# Patient Record
Sex: Female | Born: 1940 | Race: Black or African American | Hispanic: No | Marital: Married | Smoking: Former smoker
Health system: Southern US, Community
[De-identification: ages and names within clinical notes are randomized; demographics above are authoritative.]

## PROBLEM LIST (undated history)

## (undated) DIAGNOSIS — I639 Cerebral infarction, unspecified: Secondary | ICD-10-CM

## (undated) DIAGNOSIS — E119 Type 2 diabetes mellitus without complications: Secondary | ICD-10-CM

## (undated) HISTORY — PX: ABDOMINAL HYSTERECTOMY: SHX81

---

## 1999-11-04 ENCOUNTER — Encounter: Admission: RE | Admit: 1999-11-04 | Discharge: 2000-02-02 | Payer: Self-pay | Admitting: Internal Medicine

## 2003-12-04 ENCOUNTER — Emergency Department (HOSPITAL_COMMUNITY): Admission: EM | Admit: 2003-12-04 | Discharge: 2003-12-04 | Payer: Self-pay | Admitting: Family Medicine

## 2003-12-07 ENCOUNTER — Emergency Department (HOSPITAL_COMMUNITY): Admission: EM | Admit: 2003-12-07 | Discharge: 2003-12-07 | Payer: Self-pay | Admitting: Family Medicine

## 2007-07-05 ENCOUNTER — Ambulatory Visit: Payer: Self-pay | Admitting: Vascular Surgery

## 2007-12-20 ENCOUNTER — Ambulatory Visit: Payer: Self-pay | Admitting: Vascular Surgery

## 2009-05-07 ENCOUNTER — Ambulatory Visit: Payer: Self-pay | Admitting: Vascular Surgery

## 2010-05-27 NOTE — Assessment & Plan Note (Signed)
OFFICE VISIT   Audrey Carr, Audrey Carr  DOB:  12-03-1940                                       12/20/2007  [id number]#:[id number]   The patient returns today for followup regarding her lower extremity  occlusive disease.  She was previously evaluated in June of this year  with claudication symptoms after walking about one half block.  The  right leg was worse than the left.  We discussed an angiogram because of  possible stenosis in the right mid superficial femoral artery and  actually had this scheduled but it was cancelled by her and no angiogram  was performed.  She states that since that time her symptoms have  improved and she does not feel that they are limiting her significantly.  Lower extremity Dopplers have been performed at [facility] in June  of 2009 which revealed ABIs of 0.3 bilaterally.  ABIs were not done in  our office therefore were repeated today and our results show 0.55 on  the right, 0.46 on the left due to femoral, popliteal and tibial  occlusive disease.  She denies any rest pain or history of nonhealing  ulcers and is able to ambulate fairly good distances she states.   PHYSICAL EXAMINATION:  Vital signs:  Blood pressure 155/86, heart rate  96, respirations are 18.  Neck:  Her carotid pulse is 3+, no audible  bruits.  Neurological:  Normal.  Chest:  Clear to auscultation.  Abdomen:  Soft, nontender with no masses.  She has 3+ femoral pulses  bilaterally with no popliteal or distal pulses palpable.  Both feet are  well-perfused.   I do not think any treatment is necessary for this nice lady since her  symptoms are not severe.  She does have femoral, popliteal and tibial  disease and if her symptoms worsen please let us know and we will be  happy to reevaluate her at that time but I have reassured her regarding  the above findings.   Quita Skye Hart Rochester, M.D.  Electronically Signed   JDL/MEDQ  D:  12/20/2007  T:  12/21/2007  Job:  [id number]   cc:   Loraine Leriche A. Perini, M.D.

## 2010-05-27 NOTE — Consult Note (Signed)
VASCULAR SURGERY CONSULTATION   Audrey Carr, Audrey Carr  DOB:  [birth year]                                       07/05/2007  [id number]#:[id number]   This is a vascular surgery consultation.  The patient was referred by  Dr. Waynard Edwards for a vascular surgery consultation regarding her lower  extremity circulation.  This 70 year old female states that 2-1/2 months  ago she began experiencing severe right calf discomfort after walking  about 1/2 block.  This would cause her to have to stop and rest in order  to get relief.  She has also noted some burning discomfort and  occasional numbness in both feet even at rest but has had no history of  nonhealing ulcers, infections ulceration or other problems.  This calf  discomfort is limiting her fairly significantly at the present time.  She has very minimal symptoms in contralateral left leg.  Lower  extremity arterial Dopplers were performed by [facility] on  06/23/2007 which revealed ABIs of 0.3 bilaterally with an area of high  velocity in the right mid superficial femoral artery measuring 296  cm/sec, suggesting a tight stenosis in this area.   PAST MEDICAL HISTORY:  1. Non-insulin-dependent diabetes mellitus.  2. Hypertension.  3. Negative for coronary artery disease, hyperlipidemia, CVA and COPD.   PAST SURGICAL HISTORY:  Hysterectomy.   FAMILY HISTORY:  Positive for diabetes in her father and possibly her  mother and positive for stroke in her father.  Negative for coronary  artery disease.   SOCIAL HISTORY:  She is married but is retired, her husband having  suffered a stroke and she is his caregiver.  She smokes 3/4 of a pack of  cigarettes per day for 40+ years, does not use alcohol.   REVIEW OF SYSTEMS:  Denies any chest pain, dyspnea on exertion, PND,  orthopnea, anorexia, weight loss, hemoptysis, bronchitis, wheezing, no  GI or GU symptoms.  Has arthritis and joint pain and muscle pain.   ALLERGIES:  None  known.   MEDICATIONS:  Please see health history form.  She does take aspirin and  Plavix which was just started a few days ago.   PHYSICAL EXAM:  Vital signs:  Blood pressure is 125/70, heart rate is  82, respirations 14.  General:  She is a healthy-appearing female in no  apparent distress, alert and oriented x3.  Neck:  Supple, 3+ carotid  pulses palpable.  No bruits are audible.  Neurological:  Normal.  No  palpable adenopathy in the neck.  Upper extremity pulses are 3+  bilaterally.  No skin rashes noted.  Chest:  Clear to auscultation.  Cardiovascular:  Reveals regular rhythm with no murmurs.  Abdomen:  Soft, nontender with no palpable masses.  Extremities:  Reveals 3+  femoral pulses bilaterally with absent popliteal or distal pulses.  Both  feet are slightly cool but no evidence of infection or ulceration is  noted.  No edema is noted.   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  She would like to further evaluate this so we  have scheduled her for an angiogram to be done by Dr. Myra Gianotti on 07/07  with possible PTA and stenting of her right superficial femoral artery  if indicated.  Quita Skye Hart Rochester, M.D.  Electronically Signed  JDL/MEDQ  D:  07/05/2007  T:  07/06/2007  Job:  [id number]   cc:   Loraine Leriche A. Perini, M.D.

## 2010-05-27 NOTE — Assessment & Plan Note (Signed)
OFFICE VISIT   KIAYA, HALIBURTON  DOB:  1940-01-15                                       05/07/2009  [id number]#:[id number]   The patient is a 70 year old female patient referred back by Dr. Waynard Edwards  for further vascular evaluation.  She has claudication symptoms in both  legs, right worse than left.  She is able to ambulate about 1 block  before her symptoms begin.  She has no history of rest pain, nonhealing  ulcers, infection or gangrene.  I evaluated her in June of 2009 and  again in December 2009 and her ABIs have been very stable since that  time.  She states she is able to do her daily activities as well as mow  the yard without problems.   CHRONIC MEDICAL PROBLEMS:  1. Hypertension.  2. Hyperlipidemia.  3. Diabetes mellitus type 1.  4. GERD.  5. Tobacco abuse.   FAMILY HISTORY:  She is married and retired.  Smoked three-quarters pack  cigarettes per day, has done so for 50 years.  Does not use alcohol.   REVIEW OF SYSTEMS:  Negative chest pain, dyspnea on exertion.  No  asthma, wheezing.  Has occasional arthritis and muscle pain and urinary  frequency.  All other systems on review of systems are negative.   PHYSICAL EXAMINATION:  Vital signs:  Blood pressure 161/79, heart rate  76, temperature 98.  General:  Well-developed, well-nourished female in  no apparent distress, alert and oriented x3.  HEENT:  Exam normal.  EOMs  intact.  Neck:  Supple, 3+ carotid pulses are palpable.  No bruits  audible.  Chest:  Clear to auscultation.  No wheezing.  Cardiovascular:  Regular rhythm, no murmurs.  Abdomen:  Soft, nontender with no masses.  Lower extremity exam:  Reveals 3+ femoral pulses bilaterally.  No  popliteal or distal pulses palpable.  Both feet are well-perfused.   Today I ordered lower extremity arterial Dopplers which I have reviewed  and interpreted.  Her ABIs are 0.50 on the right and 0.52 on the left,  unchanged from 18 months ago.   She does  have stable claudication which is not in need of any treatment  for limb salvage.  There is no indication for quality of life at this  point because she is content with her symptoms.  If her symptoms worsen  and she desires treatment, we will be happy to see her again in the  future for further evaluation.     Quita Skye Hart Rochester, M.D.  Electronically Signed   JDL/MEDQ  D:  05/07/2009  T:  05/08/2009  Job:  [id number]

## 2011-04-21 DIAGNOSIS — E1159 Type 2 diabetes mellitus with other circulatory complications: Secondary | ICD-10-CM | POA: Diagnosis not present

## 2011-04-21 DIAGNOSIS — I739 Peripheral vascular disease, unspecified: Secondary | ICD-10-CM | POA: Diagnosis not present

## 2011-04-21 DIAGNOSIS — E785 Hyperlipidemia, unspecified: Secondary | ICD-10-CM | POA: Diagnosis not present

## 2011-04-21 DIAGNOSIS — I10 Essential (primary) hypertension: Secondary | ICD-10-CM | POA: Diagnosis not present

## 2011-08-20 DIAGNOSIS — H2510 Age-related nuclear cataract, unspecified eye: Secondary | ICD-10-CM | POA: Diagnosis not present

## 2011-08-20 DIAGNOSIS — H04129 Dry eye syndrome of unspecified lacrimal gland: Secondary | ICD-10-CM | POA: Diagnosis not present

## 2011-08-21 DIAGNOSIS — E1159 Type 2 diabetes mellitus with other circulatory complications: Secondary | ICD-10-CM | POA: Diagnosis not present

## 2011-08-21 DIAGNOSIS — I739 Peripheral vascular disease, unspecified: Secondary | ICD-10-CM | POA: Diagnosis not present

## 2011-08-21 DIAGNOSIS — E1149 Type 2 diabetes mellitus with other diabetic neurological complication: Secondary | ICD-10-CM | POA: Diagnosis not present

## 2011-08-21 DIAGNOSIS — I10 Essential (primary) hypertension: Secondary | ICD-10-CM | POA: Diagnosis not present

## 2011-10-01 DIAGNOSIS — Z23 Encounter for immunization: Secondary | ICD-10-CM | POA: Diagnosis not present

## 2011-11-24 DIAGNOSIS — I10 Essential (primary) hypertension: Secondary | ICD-10-CM | POA: Diagnosis not present

## 2011-11-24 DIAGNOSIS — I739 Peripheral vascular disease, unspecified: Secondary | ICD-10-CM | POA: Diagnosis not present

## 2011-11-24 DIAGNOSIS — F329 Major depressive disorder, single episode, unspecified: Secondary | ICD-10-CM | POA: Diagnosis not present

## 2011-11-24 DIAGNOSIS — E119 Type 2 diabetes mellitus without complications: Secondary | ICD-10-CM | POA: Diagnosis not present

## 2012-08-10 DIAGNOSIS — I739 Peripheral vascular disease, unspecified: Secondary | ICD-10-CM | POA: Diagnosis not present

## 2012-08-10 DIAGNOSIS — IMO0002 Reserved for concepts with insufficient information to code with codable children: Secondary | ICD-10-CM | POA: Diagnosis not present

## 2012-08-10 DIAGNOSIS — I10 Essential (primary) hypertension: Secondary | ICD-10-CM | POA: Diagnosis not present

## 2012-08-10 DIAGNOSIS — E1159 Type 2 diabetes mellitus with other circulatory complications: Secondary | ICD-10-CM | POA: Diagnosis not present

## 2012-08-10 DIAGNOSIS — F329 Major depressive disorder, single episode, unspecified: Secondary | ICD-10-CM | POA: Diagnosis not present

## 2012-08-10 DIAGNOSIS — Z79899 Other long term (current) drug therapy: Secondary | ICD-10-CM | POA: Diagnosis not present

## 2012-08-10 DIAGNOSIS — Z1331 Encounter for screening for depression: Secondary | ICD-10-CM | POA: Diagnosis not present

## 2012-08-10 DIAGNOSIS — E559 Vitamin D deficiency, unspecified: Secondary | ICD-10-CM | POA: Diagnosis not present

## 2012-08-17 DIAGNOSIS — E119 Type 2 diabetes mellitus without complications: Secondary | ICD-10-CM | POA: Diagnosis not present

## 2012-08-17 DIAGNOSIS — H35039 Hypertensive retinopathy, unspecified eye: Secondary | ICD-10-CM | POA: Diagnosis not present

## 2012-08-17 DIAGNOSIS — H40019 Open angle with borderline findings, low risk, unspecified eye: Secondary | ICD-10-CM | POA: Diagnosis not present

## 2012-09-14 DIAGNOSIS — IMO0002 Reserved for concepts with insufficient information to code with codable children: Secondary | ICD-10-CM | POA: Diagnosis not present

## 2012-09-14 DIAGNOSIS — F329 Major depressive disorder, single episode, unspecified: Secondary | ICD-10-CM | POA: Diagnosis not present

## 2012-09-14 DIAGNOSIS — Z23 Encounter for immunization: Secondary | ICD-10-CM | POA: Diagnosis not present

## 2012-09-14 DIAGNOSIS — F17200 Nicotine dependence, unspecified, uncomplicated: Secondary | ICD-10-CM | POA: Diagnosis not present

## 2012-09-14 DIAGNOSIS — E1159 Type 2 diabetes mellitus with other circulatory complications: Secondary | ICD-10-CM | POA: Diagnosis not present

## 2012-09-14 DIAGNOSIS — I739 Peripheral vascular disease, unspecified: Secondary | ICD-10-CM | POA: Diagnosis not present

## 2012-09-14 DIAGNOSIS — I10 Essential (primary) hypertension: Secondary | ICD-10-CM | POA: Diagnosis not present

## 2012-09-14 DIAGNOSIS — R269 Unspecified abnormalities of gait and mobility: Secondary | ICD-10-CM | POA: Diagnosis not present

## 2012-10-27 DIAGNOSIS — E785 Hyperlipidemia, unspecified: Secondary | ICD-10-CM | POA: Diagnosis not present

## 2012-10-27 DIAGNOSIS — E119 Type 2 diabetes mellitus without complications: Secondary | ICD-10-CM | POA: Diagnosis not present

## 2012-10-27 DIAGNOSIS — IMO0002 Reserved for concepts with insufficient information to code with codable children: Secondary | ICD-10-CM | POA: Diagnosis not present

## 2012-10-27 DIAGNOSIS — R269 Unspecified abnormalities of gait and mobility: Secondary | ICD-10-CM | POA: Diagnosis not present

## 2012-10-27 DIAGNOSIS — I10 Essential (primary) hypertension: Secondary | ICD-10-CM | POA: Diagnosis not present

## 2013-01-27 DIAGNOSIS — E785 Hyperlipidemia, unspecified: Secondary | ICD-10-CM | POA: Diagnosis not present

## 2013-01-27 DIAGNOSIS — F3289 Other specified depressive episodes: Secondary | ICD-10-CM | POA: Diagnosis not present

## 2013-01-27 DIAGNOSIS — E1159 Type 2 diabetes mellitus with other circulatory complications: Secondary | ICD-10-CM | POA: Diagnosis not present

## 2013-01-27 DIAGNOSIS — F329 Major depressive disorder, single episode, unspecified: Secondary | ICD-10-CM | POA: Diagnosis not present

## 2013-01-27 DIAGNOSIS — Z23 Encounter for immunization: Secondary | ICD-10-CM | POA: Diagnosis not present

## 2013-01-27 DIAGNOSIS — I739 Peripheral vascular disease, unspecified: Secondary | ICD-10-CM | POA: Diagnosis not present

## 2013-01-27 DIAGNOSIS — I10 Essential (primary) hypertension: Secondary | ICD-10-CM | POA: Diagnosis not present

## 2013-01-27 DIAGNOSIS — IMO0002 Reserved for concepts with insufficient information to code with codable children: Secondary | ICD-10-CM | POA: Diagnosis not present

## 2013-09-12 DIAGNOSIS — IMO0002 Reserved for concepts with insufficient information to code with codable children: Secondary | ICD-10-CM | POA: Diagnosis not present

## 2013-09-12 DIAGNOSIS — Z1331 Encounter for screening for depression: Secondary | ICD-10-CM | POA: Diagnosis not present

## 2013-09-12 DIAGNOSIS — F329 Major depressive disorder, single episode, unspecified: Secondary | ICD-10-CM | POA: Diagnosis not present

## 2013-09-12 DIAGNOSIS — I739 Peripheral vascular disease, unspecified: Secondary | ICD-10-CM | POA: Diagnosis not present

## 2013-09-12 DIAGNOSIS — F17200 Nicotine dependence, unspecified, uncomplicated: Secondary | ICD-10-CM | POA: Diagnosis not present

## 2013-09-12 DIAGNOSIS — R5381 Other malaise: Secondary | ICD-10-CM | POA: Diagnosis not present

## 2013-09-12 DIAGNOSIS — R5383 Other fatigue: Secondary | ICD-10-CM | POA: Diagnosis not present

## 2013-09-12 DIAGNOSIS — E1159 Type 2 diabetes mellitus with other circulatory complications: Secondary | ICD-10-CM | POA: Diagnosis not present

## 2013-09-12 DIAGNOSIS — F3289 Other specified depressive episodes: Secondary | ICD-10-CM | POA: Diagnosis not present

## 2013-10-12 ENCOUNTER — Inpatient Hospital Stay (HOSPITAL_COMMUNITY)
Admission: EM | Admit: 2013-10-12 | Discharge: 2013-10-16 | DRG: 065 | Disposition: A | Discharge status: Skilled Nursing Facility | Payer: Medicare Other | Attending: Internal Medicine | Admitting: Internal Medicine

## 2013-10-12 ENCOUNTER — Emergency Department (HOSPITAL_COMMUNITY): Payer: Medicare Other

## 2013-10-12 ENCOUNTER — Encounter (HOSPITAL_COMMUNITY): Payer: Self-pay | Admitting: Emergency Medicine

## 2013-10-12 ENCOUNTER — Inpatient Hospital Stay (HOSPITAL_COMMUNITY): Payer: Medicare Other

## 2013-10-12 DIAGNOSIS — I5032 Chronic diastolic (congestive) heart failure: Secondary | ICD-10-CM | POA: Diagnosis present

## 2013-10-12 DIAGNOSIS — R29810 Facial weakness: Secondary | ICD-10-CM | POA: Diagnosis present

## 2013-10-12 DIAGNOSIS — I519 Heart disease, unspecified: Secondary | ICD-10-CM | POA: Diagnosis not present

## 2013-10-12 DIAGNOSIS — M6281 Muscle weakness (generalized): Secondary | ICD-10-CM | POA: Diagnosis present

## 2013-10-12 DIAGNOSIS — E785 Hyperlipidemia, unspecified: Secondary | ICD-10-CM | POA: Diagnosis present

## 2013-10-12 DIAGNOSIS — I639 Cerebral infarction, unspecified: Secondary | ICD-10-CM

## 2013-10-12 DIAGNOSIS — I6930 Unspecified sequelae of cerebral infarction: Secondary | ICD-10-CM | POA: Diagnosis not present

## 2013-10-12 DIAGNOSIS — Z23 Encounter for immunization: Secondary | ICD-10-CM | POA: Diagnosis not present

## 2013-10-12 DIAGNOSIS — R2689 Other abnormalities of gait and mobility: Secondary | ICD-10-CM | POA: Diagnosis not present

## 2013-10-12 DIAGNOSIS — R4781 Slurred speech: Secondary | ICD-10-CM | POA: Diagnosis present

## 2013-10-12 DIAGNOSIS — I63511 Cerebral infarction due to unspecified occlusion or stenosis of right middle cerebral artery: Principal | ICD-10-CM | POA: Diagnosis present

## 2013-10-12 DIAGNOSIS — I10 Essential (primary) hypertension: Secondary | ICD-10-CM | POA: Diagnosis present

## 2013-10-12 DIAGNOSIS — E119 Type 2 diabetes mellitus without complications: Secondary | ICD-10-CM

## 2013-10-12 DIAGNOSIS — Z823 Family history of stroke: Secondary | ICD-10-CM

## 2013-10-12 DIAGNOSIS — I6789 Other cerebrovascular disease: Secondary | ICD-10-CM | POA: Diagnosis not present

## 2013-10-12 DIAGNOSIS — F17210 Nicotine dependence, cigarettes, uncomplicated: Secondary | ICD-10-CM | POA: Diagnosis present

## 2013-10-12 DIAGNOSIS — Z833 Family history of diabetes mellitus: Secondary | ICD-10-CM | POA: Diagnosis not present

## 2013-10-12 DIAGNOSIS — R4181 Age-related cognitive decline: Secondary | ICD-10-CM | POA: Diagnosis not present

## 2013-10-12 DIAGNOSIS — I700 Atherosclerosis of aorta: Secondary | ICD-10-CM | POA: Diagnosis not present

## 2013-10-12 DIAGNOSIS — R41841 Cognitive communication deficit: Secondary | ICD-10-CM | POA: Diagnosis not present

## 2013-10-12 DIAGNOSIS — R278 Other lack of coordination: Secondary | ICD-10-CM | POA: Diagnosis not present

## 2013-10-12 DIAGNOSIS — E1149 Type 2 diabetes mellitus with other diabetic neurological complication: Secondary | ICD-10-CM | POA: Diagnosis not present

## 2013-10-12 DIAGNOSIS — R531 Weakness: Secondary | ICD-10-CM | POA: Diagnosis not present

## 2013-10-12 HISTORY — DX: Cerebral infarction, unspecified: I63.9

## 2013-10-12 HISTORY — DX: Type 2 diabetes mellitus without complications: E11.9

## 2013-10-12 LAB — DIFFERENTIAL
BASOS PCT: 1 % (ref 0–1)
Basophils Absolute: 0.1 10*3/uL (ref 0.0–0.1)
EOS PCT: 1 % (ref 0–5)
Eosinophils Absolute: 0.1 10*3/uL (ref 0.0–0.7)
LYMPHS PCT: 38 % (ref 12–46)
Lymphs Abs: 3 10*3/uL (ref 0.7–4.0)
MONO ABS: 0.3 10*3/uL (ref 0.1–1.0)
Monocytes Relative: 4 % (ref 3–12)
Neutro Abs: 4.4 10*3/uL (ref 1.7–7.7)
Neutrophils Relative %: 56 % (ref 43–77)

## 2013-10-12 LAB — URINALYSIS, ROUTINE W REFLEX MICROSCOPIC
BILIRUBIN URINE: NEGATIVE
HGB URINE DIPSTICK: NEGATIVE
Ketones, ur: NEGATIVE mg/dL
Leukocytes, UA: NEGATIVE
Nitrite: NEGATIVE
Protein, ur: NEGATIVE mg/dL
SPECIFIC GRAVITY, URINE: 1.03 (ref 1.005–1.030)
UROBILINOGEN UA: 0.2 mg/dL (ref 0.0–1.0)
pH: 6.5 (ref 5.0–8.0)

## 2013-10-12 LAB — I-STAT CHEM 8, ED
BUN: 14 mg/dL (ref 6–23)
Calcium, Ion: 1.18 mmol/L (ref 1.13–1.30)
Chloride: 105 mEq/L (ref 96–112)
Creatinine, Ser: 0.7 mg/dL (ref 0.50–1.10)
Glucose, Bld: 175 mg/dL — ABNORMAL HIGH (ref 70–99)
HCT: 48 % — ABNORMAL HIGH (ref 36.0–46.0)
Hemoglobin: 16.3 g/dL — ABNORMAL HIGH (ref 12.0–15.0)
Potassium: 4.1 mEq/L (ref 3.7–5.3)
SODIUM: 141 meq/L (ref 137–147)
TCO2: 25 mmol/L (ref 0–100)

## 2013-10-12 LAB — APTT: aPTT: 25 seconds (ref 24–37)

## 2013-10-12 LAB — RAPID URINE DRUG SCREEN, HOSP PERFORMED
Amphetamines: NOT DETECTED
Barbiturates: NOT DETECTED
Benzodiazepines: NOT DETECTED
COCAINE: NOT DETECTED
OPIATES: NOT DETECTED
TETRAHYDROCANNABINOL: NOT DETECTED

## 2013-10-12 LAB — COMPREHENSIVE METABOLIC PANEL
ALT: 9 U/L (ref 0–35)
ANION GAP: 16 — AB (ref 5–15)
AST: 15 U/L (ref 0–37)
Albumin: 4.1 g/dL (ref 3.5–5.2)
Alkaline Phosphatase: 131 U/L — ABNORMAL HIGH (ref 39–117)
BUN: 13 mg/dL (ref 6–23)
CALCIUM: 10 mg/dL (ref 8.4–10.5)
CO2: 25 meq/L (ref 19–32)
CREATININE: 0.72 mg/dL (ref 0.50–1.10)
Chloride: 103 mEq/L (ref 96–112)
GFR calc Af Amer: >90 mL/min (ref >90)
GFR, EST NON AFRICAN AMERICAN: 83 mL/min — AB (ref >90)
Glucose, Bld: 174 mg/dL — ABNORMAL HIGH (ref 70–99)
Potassium: 4.3 mEq/L (ref 3.7–5.3)
Sodium: 144 mEq/L (ref 137–147)
Total Bilirubin: 0.3 mg/dL (ref 0.3–1.2)
Total Protein: 8.1 g/dL (ref 6.0–8.3)

## 2013-10-12 LAB — CBC
HEMATOCRIT: 44.8 % (ref 36.0–46.0)
Hemoglobin: 15.3 g/dL — ABNORMAL HIGH (ref 12.0–15.0)
MCH: 31.5 pg (ref 26.0–34.0)
MCHC: 34.2 g/dL (ref 30.0–36.0)
MCV: 92.4 fL (ref 78.0–100.0)
Platelets: 238 10*3/uL (ref 150–400)
RBC: 4.85 MIL/uL (ref 3.87–5.11)
RDW: 14.1 % (ref 11.5–15.5)
WBC: 7.8 10*3/uL (ref 4.0–10.5)

## 2013-10-12 LAB — PROTIME-INR
INR: 0.94 (ref 0.00–1.49)
Prothrombin Time: 12.6 seconds (ref 11.6–15.2)

## 2013-10-12 LAB — ETHANOL

## 2013-10-12 LAB — I-STAT TROPONIN, ED: TROPONIN I, POC: 0 ng/mL (ref 0.00–0.08)

## 2013-10-12 LAB — URINE MICROSCOPIC-ADD ON

## 2013-10-12 LAB — GLUCOSE, CAPILLARY: GLUCOSE-CAPILLARY: 132 mg/dL — AB (ref 70–99)

## 2013-10-12 MED ORDER — ASPIRIN 325 MG PO TABS
325.0000 mg | ORAL_TABLET | Freq: Every day | ORAL | Status: DC
  Administered 2013-10-13 – 2013-10-14 (×2): 325 mg via ORAL
  Filled 2013-10-12 (×2): qty 1

## 2013-10-12 MED ORDER — ASPIRIN 300 MG RE SUPP
300.0000 mg | Freq: Every day | RECTAL | Status: DC
  Filled 2013-10-12 (×2): qty 1

## 2013-10-12 MED ORDER — GLIPIZIDE ER 10 MG PO TB24
10.0000 mg | ORAL_TABLET | Freq: Every day | ORAL | Status: DC
  Administered 2013-10-14 – 2013-10-16 (×3): 10 mg via ORAL
  Filled 2013-10-12 (×5): qty 1

## 2013-10-12 MED ORDER — ATORVASTATIN CALCIUM 40 MG PO TABS
40.0000 mg | ORAL_TABLET | Freq: Every day | ORAL | Status: DC
  Administered 2013-10-13: 40 mg via ORAL
  Filled 2013-10-12: qty 1

## 2013-10-12 MED ORDER — INSULIN ASPART 100 UNIT/ML ~~LOC~~ SOLN
0.0000 [IU] | Freq: Three times a day (TID) | SUBCUTANEOUS | Status: DC
  Administered 2013-10-13: 1 [IU] via SUBCUTANEOUS
  Administered 2013-10-13 (×2): 2 [IU] via SUBCUTANEOUS
  Administered 2013-10-14: 3 [IU] via SUBCUTANEOUS
  Administered 2013-10-14 (×2): 2 [IU] via SUBCUTANEOUS
  Administered 2013-10-15: 5 [IU] via SUBCUTANEOUS
  Administered 2013-10-15 – 2013-10-16 (×2): 2 [IU] via SUBCUTANEOUS
  Administered 2013-10-16: 1 [IU] via SUBCUTANEOUS

## 2013-10-12 MED ORDER — AMLODIPINE BESYLATE 5 MG PO TABS
5.0000 mg | ORAL_TABLET | Freq: Every day | ORAL | Status: DC
  Administered 2013-10-13: 5 mg via ORAL
  Filled 2013-10-12: qty 1

## 2013-10-12 MED ORDER — SENNOSIDES-DOCUSATE SODIUM 8.6-50 MG PO TABS
1.0000 | ORAL_TABLET | Freq: Every evening | ORAL | Status: DC | PRN
  Filled 2013-10-12: qty 1

## 2013-10-12 MED ORDER — ENOXAPARIN SODIUM 40 MG/0.4ML ~~LOC~~ SOLN
40.0000 mg | SUBCUTANEOUS | Status: DC
  Administered 2013-10-12 – 2013-10-15 (×4): 40 mg via SUBCUTANEOUS
  Filled 2013-10-12 (×5): qty 0.4

## 2013-10-12 MED ORDER — SODIUM CHLORIDE 0.9 % IV SOLN
INTRAVENOUS | Status: DC
  Administered 2013-10-12: via INTRAVENOUS

## 2013-10-12 MED ORDER — ESCITALOPRAM OXALATE 10 MG PO TABS
10.0000 mg | ORAL_TABLET | Freq: Every day | ORAL | Status: DC
Start: 2013-10-13 — End: 2013-10-16
  Administered 2013-10-13 – 2013-10-16 (×4): 10 mg via ORAL
  Filled 2013-10-12 (×4): qty 1

## 2013-10-12 MED ORDER — STROKE: EARLY STAGES OF RECOVERY BOOK
Freq: Once | Status: AC
  Administered 2013-10-12: 22:00:00
  Filled 2013-10-12: qty 1

## 2013-10-12 NOTE — Progress Notes (Signed)
AHal Hope notified about BP 183/104. No orders to treat unless BP systolic >562. Will continue to monitor pt, currently asymptomatic.   Prescilla Sours, Therapist, sports

## 2013-10-12 NOTE — ED Notes (Signed)
Pt presents from home via [facility] with c/o facial droop. Pt lives alone and saw her neighbor last night around 2100 and he noticed left sided facial droop. Pt declined medical attention at that time. Pt saw the same neighbor today and he contacted 911 for the patient to be evaluated.  Pt has no other complaints.  Per EMS grips are equal and patient denies visual changes.

## 2013-10-12 NOTE — Consult Note (Signed)
Referring Physician: Frederich Cha    Chief Complaint: New onset left facial droop.  HPI: Audrey Carr is an 73 y.o. female history diabetes mellitus, hypertension and hyperlipidemia presenting with new onset left lower facial droop which was first noticed about 8 PM last night. Patient has also developed slurring of speech. She's had no difficulty with swallowing. She said no left upper normal left lower extremity weakness no numbness. Has no previous history of stroke or TIA. She has not been on antiplatelet therapy. MRI of her brain showed moderate size acute right MCA infarction. 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 NIH stroke score was 3.  LSN: 8 PM on 10/11/2013 tPA Given: No: Beyond time under for treatment consideration mRankin:  Past Medical History  Diagnosis Date  . Diabetes mellitus without complication     No family history on file.   Medications: I have reviewed the patient's current medications.  ROS: History obtained from the patient  General ROS: negative for - chills, fatigue, fever, night sweats, weight gain or weight loss Psychological ROS: negative for - behavioral disorder, hallucinations, memory difficulties, mood swings or suicidal ideation Ophthalmic ROS: negative for - blurry vision, double vision, eye pain or loss of vision ENT ROS: negative for - epistaxis, nasal discharge, oral lesions, sore throat, tinnitus or vertigo Allergy and Immunology ROS: negative for - hives or itchy/watery eyes Hematological and Lymphatic ROS: negative for - bleeding problems, bruising or swollen lymph nodes Endocrine ROS: negative for - galactorrhea, hair pattern changes, polydipsia/polyuria or temperature intolerance Respiratory ROS: negative for - cough, hemoptysis, shortness of breath or wheezing Cardiovascular ROS: negative for - chest pain, dyspnea on exertion, edema or irregular  heartbeat Gastrointestinal ROS: negative for - abdominal pain, diarrhea, hematemesis, nausea/vomiting or stool incontinence Genito-Urinary ROS: negative for - dysuria, hematuria, incontinence or urinary frequency/urgency Musculoskeletal ROS: negative for - joint swelling or muscular weakness Neurological ROS: as noted in HPI Dermatological ROS: negative for rash and skin lesion changes  Physical Examination: Blood pressure 176/84, pulse 72, temperature 97.9 F (36.6 C), temperature source Oral, resp. rate 15, height 5\' 3"  (1.6 m), SpO2 100.00%.  Neurologic Examination: Mental Status: Alert, oriented, thought content appropriate.  Speech slightly slurred without evidence of aphasia. Able to follow commands without difficulty. Cranial Nerves: II-Visual fields were normal. III/IV/VI-Pupils were equal and reacted. Extraocular movements were full and conjugate.    V/VII-no facial numbness; moderate left lower facial weakness. VIII-normal. X-mild dysarthria; symmetrical palatal movement. XII-midline tongue extension Motor: 5/5 bilaterally with normal tone and bulk Sensory: Normal throughout. Deep Tendon Reflexes: 1+ and symmetric. Plantars: Mute bilaterally Cerebellar: Normal finger-to-nose testing.  Ct Head Wo Contrast  10/12/2013   CLINICAL DATA:  New onset of facial droop ; no history of CNS abnormality  EXAM: CT HEAD WITHOUT CONTRAST  TECHNIQUE: Contiguous axial images were obtained from the base of the skull through the vertex without intravenous contrast.  COMPARISON:  None.  FINDINGS: There is mild diffuse cerebral and cerebellar atrophy with compensatory ventriculomegaly. There is decreased density in the deep white matter of both cerebral hemispheres. This is greater on the right in the frontal region that elsewhere. There are basal ganglia calcifications bilaterally. There is no acute intracranial hemorrhage. The cerebellum and brainstem are unremarkable.  The observed paranasal  sinuses and mastoid air cells are clear. The middle ear cavities are well pneumatized. The internal auditory canals are unremarkable.  IMPRESSION: 1. There is no  acute intracranial hemorrhage nor objective evidence of acute ischemic change. 2. There is decreased density in the deep white matter of both cerebral hemispheres, greatest in the right frontal region, consistent with chronic small vessel ischemic change. If the patient's clinical findings do not reflect a typical Bell's palsy, MRI of the brain now may be useful.   Electronically Signed   By: David  Martinique   On: 10/12/2013 15:35   Mr Jodene Nam Head Wo Contrast  10/12/2013   CLINICAL DATA:  Left-sided facial droop since last night.  EXAM: MRI HEAD WITHOUT CONTRAST  MRA HEAD WITHOUT CONTRAST  TECHNIQUE: Multiplanar, multiecho pulse sequences of the brain and surrounding structures were obtained without intravenous contrast. Angiographic images of the head were obtained using MRA technique without contrast.  COMPARISON:  Head CT 10/12/2013  FINDINGS: MRI HEAD FINDINGS  There is a moderate-sized acute infarct involving the right cerebral hemisphere predominantly in the white matter of the right frontal lobe and corona radiata in the MCA territory. There is no definite evidence of acute hemorrhage associated with the area of infarction. A few scattered remote microhemorrhages are noted in the cerebral hemispheres. Foci of T2 hyperintensity in the subcortical and deep cerebral white matter and pons are nonspecific but compatible with mild chronic small vessel ischemic disease. There is mild generalized cerebral atrophy. There is no mass, midline shift, or extra-axial fluid collection.  Orbits are unremarkable. Paranasal sinuses and mastoid air cells are clear. Major intracranial vascular flow voids are preserved.  MRA HEAD FINDINGS  Images are moderately to severely degraded by motion artifact. Visualized distal vertebral arteries are patent with the right being  dominant. There is mild, diffuse irregularity of the intracranial right vertebral artery with moderate stenosis distally. PICA origins are patent. Right AICA origin appears patent. Basilar artery is patent with evaluation for stenosis limited by extensive motion artifact in its proximal and mid portions. P1 segments are patent without evidence of stenosis. Moderate bilateral PCA branch vessel irregular narrowing is present. Posterior communicating arteries are not clearly identified.  Proximal intracranial internal carotid arteries are patent without evidence of stenosis. There is severe motion artifact through the cavernous and supraclinoid carotid, limiting evaluation. At least moderate if not severe right cavernous and proximal supraclinoid ICA stenosis is suspected, however motion limits characterization. Mild cavernous carotid stenosis is also suspected on the left.  Right M1 segment is patent with at most mild narrowing in its midportion. M2 divisions are patent at their origins, however there is occlusion of the superior M2 division approximately 5 mm beyond its origin. Left M1 segment is patent without stenosis. Moderate left MCA branch vessel irregularity is present. A1 segments are patent with mild right greater than left vessel irregularity but no high-grade stenosis. Mild-to-moderate ACA branch vessel irregularity is present. No gross intracranial aneurysm is identified.  IMPRESSION: 1. Moderate-sized, acute right MCA infarct. 2. Mild chronic small vessel ischemic disease and cerebral atrophy. Scattered, VIII cerebral micro hemorrhages. 3. Moderately to severely motion degraded head MRA. Right M2 superior division occlusion just beyond its origin. 4. 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   Electronically Signed   By: Logan Bores   On: 10/12/2013 19:55   Mr Brain Wo Contrast  10/12/2013   CLINICAL DATA:  Left-sided facial droop since last night.  EXAM: MRI HEAD  WITHOUT CONTRAST  MRA HEAD WITHOUT CONTRAST  TECHNIQUE: Multiplanar, multiecho pulse sequences of the brain and surrounding structures were obtained without intravenous contrast. Angiographic images of the  head were obtained using MRA technique without contrast.  COMPARISON:  Head CT 10/12/2013  FINDINGS: MRI HEAD FINDINGS  There is a moderate-sized acute infarct involving the right cerebral hemisphere predominantly in the white matter of the right frontal lobe and corona radiata in the MCA territory. There is no definite evidence of acute hemorrhage associated with the area of infarction. A few scattered remote microhemorrhages are noted in the cerebral hemispheres. Foci of T2 hyperintensity in the subcortical and deep cerebral white matter and pons are nonspecific but compatible with mild chronic small vessel ischemic disease. There is mild generalized cerebral atrophy. There is no mass, midline shift, or extra-axial fluid collection.  Orbits are unremarkable. Paranasal sinuses and mastoid air cells are clear. Major intracranial vascular flow voids are preserved.  MRA HEAD FINDINGS  Images are moderately to severely degraded by motion artifact. Visualized distal vertebral arteries are patent with the right being dominant. There is mild, diffuse irregularity of the intracranial right vertebral artery with moderate stenosis distally. PICA origins are patent. Right AICA origin appears patent. Basilar artery is patent with evaluation for stenosis limited by extensive motion artifact in its proximal and mid portions. P1 segments are patent without evidence of stenosis. Moderate bilateral PCA branch vessel irregular narrowing is present. Posterior communicating arteries are not clearly identified.  Proximal intracranial internal carotid arteries are patent without evidence of stenosis. There is severe motion artifact through the cavernous and supraclinoid carotid, limiting evaluation. At least moderate if not severe  right cavernous and proximal supraclinoid ICA stenosis is suspected, however motion limits characterization. Mild cavernous carotid stenosis is also suspected on the left.  Right M1 segment is patent with at most mild narrowing in its midportion. M2 divisions are patent at their origins, however there is occlusion of the superior M2 division approximately 5 mm beyond its origin. Left M1 segment is patent without stenosis. Moderate left MCA branch vessel irregularity is present. A1 segments are patent with mild right greater than left vessel irregularity but no high-grade stenosis. Mild-to-moderate ACA branch vessel irregularity is present. No gross intracranial aneurysm is identified.  IMPRESSION: 1. Moderate-sized, acute right MCA infarct. 2. Mild chronic small vessel ischemic disease and cerebral atrophy. Scattered, VIII cerebral micro hemorrhages. 3. Moderately to severely motion degraded head MRA. Right M2 superior division occlusion just beyond its origin. 4. 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   Electronically Signed   By: Logan Bores   On: 10/12/2013 19:55    Assessment: 73 y.o. female with multiple risk factors for stroke presenting with acute right MCA territory ischemic infarction.  Stroke Risk Factors - diabetes mellitus, family history, hyperlipidemia and hypertension  Plan: 1. HgbA1c, fasting lipid panel and 2. PT consult, OT consult, Speech consult 3. Echocardiogram 4. Carotid dopplers 5. Prophylactic therapy-Antiplatelet med: Aspirin 325 mg per day 6. Risk factor modification 7. Telemetry monitoring   C.R. Nicole Kindred, MD [facility] [phone]  10/12/2013, 8:14 PM

## 2013-10-12 NOTE — ED Provider Notes (Addendum)
CSN: [id number]     Arrival date & time 10/12/13  1453 History   First MD Initiated Contact with Patient 10/12/13 1455     Chief Complaint  Patient presents with  . Facial Droop     (Consider location/radiation/quality/duration/timing/severity/associated sxs/prior Treatment) HPI Comments: Patient brought to the ER by ambulance for evaluation of facial droop. Patient went to her neighbor's house yesterday and the newborn noticed that the left side of her face was drooping. He tried to get her to go to the hospital and, but patient declined. When symptoms were persistent today, EMS was called. The patient has no complaints, she does not perceive any asymmetry. She has not had any headache. She denies numbness, tingling or weakness in any extremities. She has not had any speech difficulty.  PCP: Dr. Joylene Draft  Past Medical History  Diagnosis Date  . Diabetes mellitus without complication    Past Surgical History  Procedure Laterality Date  . Abdominal hysterectomy     No family history on file. History  Substance Use Topics  . Smoking status: Current Every Day Smoker -- 1.00 packs/day    Types: Cigarettes  . Smokeless tobacco: Never Used  . Alcohol Use: No   OB History   Grav Para Term Preterm Abortions TAB SAB Ect Mult Living                 Review of Systems  Respiratory: Negative for shortness of breath.   Cardiovascular: Negative for chest pain.  Neurological: Negative for dizziness, syncope and headaches.  All other systems reviewed and are negative.     Allergies  Review of patient's allergies indicates no known allergies.  Home Medications   Prior to Admission medications   Medication Sig Start Date End Date Taking? Authorizing Provider  amLODipine (NORVASC) 5 MG tablet Take 5 mg by mouth daily.   Yes Historical Provider, MD  atorvastatin (LIPITOR) 40 MG tablet Take 40 mg by mouth daily.   Yes Historical Provider, MD  escitalopram (LEXAPRO) 10 MG tablet Take 10  mg by mouth daily.   Yes Historical Provider, MD  glipiZIDE (GLUCOTROL XL) 10 MG 24 hr tablet Take 10 mg by mouth daily with breakfast.   Yes Historical Provider, MD  metFORMIN (GLUCOPHAGE) 500 MG tablet Take 500 mg by mouth 2 (two) times daily with a meal.   Yes Historical Provider, MD   BP 178/88  Pulse 77  Temp(Src) 97.9 F (36.6 C) (Oral)  Resp 16  Ht 5\' 3"  (1.6 m)  SpO2 100% Physical Exam  Constitutional: She is oriented to person, place, and time. She appears well-developed and well-nourished. No distress.  HENT:  Head: Normocephalic and atraumatic.  Right Ear: Hearing normal.  Left Ear: Hearing normal.  Nose: Nose normal.  Mouth/Throat: Oropharynx is clear and moist and mucous membranes are normal.  Eyes: Conjunctivae and EOM are normal. Pupils are equal, round, and reactive to light.  Neck: Normal range of motion. Neck supple.  Cardiovascular: Regular rhythm, S1 normal and S2 normal.  Exam reveals no gallop and no friction rub.   No murmur heard. Pulmonary/Chest: Effort normal and breath sounds normal. No respiratory distress. She exhibits no tenderness.  Abdominal: Soft. Normal appearance and bowel sounds are normal. There is no hepatosplenomegaly. There is no tenderness. There is no rebound, no guarding, no tenderness at McBurney's point and negative Murphy's sign. No hernia.  Musculoskeletal: Normal range of motion.  Neurological: She is alert and oriented to person, place, and time. She  has normal strength. A cranial nerve deficit (left facial droop, lower motor only (blunting of nasolabial fold with drooping of face)) is present. No sensory deficit. Coordination normal. GCS eye subscore is 4. GCS verbal subscore is 5. GCS motor subscore is 6.  Extraocular muscle movement: normal No visual field cut Pupils: equal and reactive both direct and consensual response is normal No nystagmus present    Sensory function is intact to light touch Proprioception intact  Grip  strength 5/5 symmetric in upper extremities  slight pronator drift on left (flexing of finger, rotation at wrist, no drop)  Lower extremity strength 4/5 against gravity, symmetric  Normal finger to nose bilaterally Normal heel to shin bilaterally   Skin: Skin is warm, dry and intact. No rash noted. No cyanosis.  Psychiatric: She has a normal mood and affect. Her speech is normal and behavior is normal. Thought content normal.    ED Course  Procedures (including critical care time) Labs Review Labs Reviewed  CBC - Abnormal; Notable for the following:    Hemoglobin 15.3 (*)    All other components within normal limits  COMPREHENSIVE METABOLIC PANEL - Abnormal; Notable for the following:    Glucose, Bld 174 (*)    Alkaline Phosphatase 131 (*)    GFR calc non Af Amer 83 (*)    Anion gap 16 (*)    All other components within normal limits  I-STAT CHEM 8, ED - Abnormal; Notable for the following:    Glucose, Bld 175 (*)    Hemoglobin 16.3 (*)    HCT 48.0 (*)    All other components within normal limits  ETHANOL  PROTIME-INR  APTT  DIFFERENTIAL  URINE RAPID DRUG SCREEN (HOSP PERFORMED)  URINALYSIS, ROUTINE W REFLEX MICROSCOPIC  I-STAT TROPOININ, ED    Imaging Review Ct Head Wo Contrast  10/12/2013   CLINICAL DATA:  New onset of facial droop ; no history of CNS abnormality  EXAM: CT HEAD WITHOUT CONTRAST  TECHNIQUE: Contiguous axial images were obtained from the base of the skull through the vertex without intravenous contrast.  COMPARISON:  None.  FINDINGS: There is mild diffuse cerebral and cerebellar atrophy with compensatory ventriculomegaly. There is decreased density in the deep white matter of both cerebral hemispheres. This is greater on the right in the frontal region that elsewhere. There are basal ganglia calcifications bilaterally. There is no acute intracranial hemorrhage. The cerebellum and brainstem are unremarkable.  The observed paranasal sinuses and mastoid air  cells are clear. The middle ear cavities are well pneumatized. The internal auditory canals are unremarkable.  IMPRESSION: 1. There is no acute intracranial hemorrhage nor objective evidence of acute ischemic change. 2. There is decreased density in the deep white matter of both cerebral hemispheres, greatest in the right frontal region, consistent with chronic small vessel ischemic change. If the patient's clinical findings do not reflect a typical Bell's palsy, MRI of the brain now may be useful.   Electronically Signed   By: David  Martinique   On: 10/12/2013 15:35     EKG Interpretation   Date/Time:  Thursday October 12 2013 15:11:29 EDT Ventricular Rate:  65 PR Interval:  177 QRS Duration: 95 QT Interval:  408 QTC Calculation: 424 R Axis:   35 Text Interpretation:  Sinus rhythm Probable anteroseptal infarct, old No  significant change since last tracing Confirmed by Kobie Whidby  MD,  [place] [id number]) on 10/12/2013 3:20:35 PM      MDM   Final diagnoses:  None  CVA   Patient presents to the ER for evaluation of left facial droop. Patient does not feel any changes in sensation on the left side of her face, however. Her change was noted by her neighbor. Examination did reveal blunting of the nasolabial fold on the left with drooping of the left side of her face. She also had a very slight pronator drift in her left upper extremity. CT scan was unremarkable.  MRI was performed to further evaluate. There is evidence of CVA seen on the MRI. Neurology consult has been obtained, Doctor Nicole Kindred has seen the patient. Patient will be admitted to the hospital for further management.    Orpah Greek, MD 10/12/13 [place], MD 10/12/13 2025

## 2013-10-12 NOTE — H&P (Signed)
[facility] History and Physical  Audrey Carr NWG:[id number] DOB: [birth year] DOA: 10/12/2013  Referring physician: ER physician. PCP: PROVIDER NOT IN SYSTEM Dr. Joylene Draft.  Chief Complaint: Left facial droop.  HPI: Audrey Carr is a 73 y.o. female with history of hypertension, diabetes mellitus and ongoing tobacco abuse was brought to the ER after patient was noticed to have persistent left facial droop. Patient was initially noted to have a facial droop last night by patient's neighbor on 8 PM. Since patient's symptoms were persistent patient was brought to the ER. In the ER patient had CT head followed by MRI brain which showed acute right MCA stroke. On-call neurologist Dr. Nicole Kindred was consulted and at this time patient has been admitted for further management of the acute stroke. In the ER on exam patient initially had left pronator drift which has improved. Patient also has slurred speech but denies any difficulty swallowing or any visual symptoms headache. Denies any chest pain shortness of breath nausea vomiting or abdominal pain.   Review of Systems: As presented in the history of presenting illness, rest negative.  Past Medical History  Diagnosis Date  . Diabetes mellitus without complication    Past Surgical History  Procedure Laterality Date  . Abdominal hysterectomy     Social History:  reports that she has been smoking Cigarettes.  She has been smoking about 1.00 pack per day. She has never used smokeless tobacco. She reports that she does not drink alcohol or use illicit drugs. Where does patient live home. Can patient participate in ADLs? Yes.  No Known Allergies  Family History:  Family History  Problem Relation Age of Onset  . Stroke Mother       Prior to Admission medications   Medication Sig Start Date End Date Taking? Authorizing Provider  amLODipine (NORVASC) 5 MG tablet Take 5 mg by mouth daily.   Yes Historical Provider, MD  atorvastatin (LIPITOR)  40 MG tablet Take 40 mg by mouth daily.   Yes Historical Provider, MD  escitalopram (LEXAPRO) 10 MG tablet Take 10 mg by mouth daily.   Yes Historical Provider, MD  glipiZIDE (GLUCOTROL XL) 10 MG 24 hr tablet Take 10 mg by mouth daily with breakfast.   Yes Historical Provider, MD  metFORMIN (GLUCOPHAGE) 500 MG tablet Take 500 mg by mouth 2 (two) times daily with a meal.   Yes Historical Provider, MD    Physical Exam: Filed Vitals:   10/12/13 1700 10/12/13 1730 10/12/13 1800 10/12/13 2031  BP: 190/79 184/95 176/84 183/68  Pulse: 65 76 72 69  Temp:    98.1 F (36.7 C)  TempSrc:    Oral  Resp: 13 21 15 16   Height:      SpO2: 100% 100% 100% 99%     General:  Moderately built and nourished.  Eyes: Anicteric no pallor.  ENT: Left facial droop.  Neck: No mass felt.  Cardiovascular: S1-S2 heard.  Respiratory: No rhonchi or crepitations.  Abdomen: Soft nontender bowel sounds present.  Skin: No rash.  Musculoskeletal: No edema.  Psychiatric: Appears normal.  Neurologic: Alert awake oriented to time place and person. Moves all extremities 5 x 5. Left facial droop. No tongue deviation. PERRLA positive.  Labs on Admission:  Basic Metabolic Panel:  Recent Labs Lab 10/12/13 1615 10/12/13 1625  NA 144 141  K 4.3 4.1  CL 103 105  CO2 25  --   GLUCOSE 174* 175*  BUN 13 14  CREATININE 0.72 0.70  CALCIUM  10.0  --    Liver Function Tests:  Recent Labs Lab 10/12/13 1615  AST 15  ALT 9  ALKPHOS 131*  BILITOT 0.3  PROT 8.1  ALBUMIN 4.1   No results found for this basename: LIPASE, AMYLASE,  in the last 168 hours No results found for this basename: AMMONIA,  in the last 168 hours CBC:  Recent Labs Lab 10/12/13 1615 10/12/13 1625  WBC 7.8  --   NEUTROABS 4.4  --   HGB 15.3* 16.3*  HCT 44.8 48.0*  MCV 92.4  --   PLT 238  --    Cardiac Enzymes: No results found for this basename: CKTOTAL, CKMB, CKMBINDEX, TROPONINI,  in the last 168 hours  BNP (last 3  results) No results found for this basename: PROBNP,  in the last 8760 hours CBG: No results found for this basename: GLUCAP,  in the last 168 hours  Radiological Exams on Admission: Ct Head Wo Contrast  10/12/2013   CLINICAL DATA:  New onset of facial droop ; no history of CNS abnormality  EXAM: CT HEAD WITHOUT CONTRAST  TECHNIQUE: Contiguous axial images were obtained from the base of the skull through the vertex without intravenous contrast.  COMPARISON:  None.  FINDINGS: There is mild diffuse cerebral and cerebellar atrophy with compensatory ventriculomegaly. There is decreased density in the deep white matter of both cerebral hemispheres. This is greater on the right in the frontal region that elsewhere. There are basal ganglia calcifications bilaterally. There is no acute intracranial hemorrhage. The cerebellum and brainstem are unremarkable.  The observed paranasal sinuses and mastoid air cells are clear. The middle ear cavities are well pneumatized. The internal auditory canals are unremarkable.  IMPRESSION: 1. There is no acute intracranial hemorrhage nor objective evidence of acute ischemic change. 2. There is decreased density in the deep white matter of both cerebral hemispheres, greatest in the right frontal region, consistent with chronic small vessel ischemic change. If the patient's clinical findings do not reflect a typical Bell's palsy, MRI of the brain now may be useful.   Electronically Signed   By: David  Martinique   On: 10/12/2013 15:35   Mr Jodene Nam Head Wo Contrast  10/12/2013   CLINICAL DATA:  Left-sided facial droop since last night.  EXAM: MRI HEAD WITHOUT CONTRAST  MRA HEAD WITHOUT CONTRAST  TECHNIQUE: Multiplanar, multiecho pulse sequences of the brain and surrounding structures were obtained without intravenous contrast. Angiographic images of the head were obtained using MRA technique without contrast.  COMPARISON:  Head CT 10/12/2013  FINDINGS: MRI HEAD FINDINGS  There is a  moderate-sized acute infarct involving the right cerebral hemisphere predominantly in the white matter of the right frontal lobe and corona radiata in the MCA territory. There is no definite evidence of acute hemorrhage associated with the area of infarction. A few scattered remote microhemorrhages are noted in the cerebral hemispheres. Foci of T2 hyperintensity in the subcortical and deep cerebral white matter and pons are nonspecific but compatible with mild chronic small vessel ischemic disease. There is mild generalized cerebral atrophy. There is no mass, midline shift, or extra-axial fluid collection.  Orbits are unremarkable. Paranasal sinuses and mastoid air cells are clear. Major intracranial vascular flow voids are preserved.  MRA HEAD FINDINGS  Images are moderately to severely degraded by motion artifact. Visualized distal vertebral arteries are patent with the right being dominant. There is mild, diffuse irregularity of the intracranial right vertebral artery with moderate stenosis distally. PICA origins are patent.  Right AICA origin appears patent. Basilar artery is patent with evaluation for stenosis limited by extensive motion artifact in its proximal and mid portions. P1 segments are patent without evidence of stenosis. Moderate bilateral PCA branch vessel irregular narrowing is present. Posterior communicating arteries are not clearly identified.  Proximal intracranial internal carotid arteries are patent without evidence of stenosis. There is severe motion artifact through the cavernous and supraclinoid carotid, limiting evaluation. At least moderate if not severe right cavernous and proximal supraclinoid ICA stenosis is suspected, however motion limits characterization. Mild cavernous carotid stenosis is also suspected on the left.  Right M1 segment is patent with at most mild narrowing in its midportion. M2 divisions are patent at their origins, however there is occlusion of the superior M2  division approximately 5 mm beyond its origin. Left M1 segment is patent without stenosis. Moderate left MCA branch vessel irregularity is present. A1 segments are patent with mild right greater than left vessel irregularity but no high-grade stenosis. Mild-to-moderate ACA branch vessel irregularity is present. No gross intracranial aneurysm is identified.  IMPRESSION: 1. Moderate-sized, acute right MCA infarct. 2. Mild chronic small vessel ischemic disease and cerebral atrophy. Scattered, VIII cerebral micro hemorrhages. 3. Moderately to severely motion degraded head MRA. Right M2 superior division occlusion just beyond its origin. 4. 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   Electronically Signed   By: Logan Bores   On: 10/12/2013 19:55   Mr Brain Wo Contrast  10/12/2013   CLINICAL DATA:  Left-sided facial droop since last night.  EXAM: MRI HEAD WITHOUT CONTRAST  MRA HEAD WITHOUT CONTRAST  TECHNIQUE: Multiplanar, multiecho pulse sequences of the brain and surrounding structures were obtained without intravenous contrast. Angiographic images of the head were obtained using MRA technique without contrast.  COMPARISON:  Head CT 10/12/2013  FINDINGS: MRI HEAD FINDINGS  There is a moderate-sized acute infarct involving the right cerebral hemisphere predominantly in the white matter of the right frontal lobe and corona radiata in the MCA territory. There is no definite evidence of acute hemorrhage associated with the area of infarction. A few scattered remote microhemorrhages are noted in the cerebral hemispheres. Foci of T2 hyperintensity in the subcortical and deep cerebral white matter and pons are nonspecific but compatible with mild chronic small vessel ischemic disease. There is mild generalized cerebral atrophy. There is no mass, midline shift, or extra-axial fluid collection.  Orbits are unremarkable. Paranasal sinuses and mastoid air cells are clear. Major intracranial  vascular flow voids are preserved.  MRA HEAD FINDINGS  Images are moderately to severely degraded by motion artifact. Visualized distal vertebral arteries are patent with the right being dominant. There is mild, diffuse irregularity of the intracranial right vertebral artery with moderate stenosis distally. PICA origins are patent. Right AICA origin appears patent. Basilar artery is patent with evaluation for stenosis limited by extensive motion artifact in its proximal and mid portions. P1 segments are patent without evidence of stenosis. Moderate bilateral PCA branch vessel irregular narrowing is present. Posterior communicating arteries are not clearly identified.  Proximal intracranial internal carotid arteries are patent without evidence of stenosis. There is severe motion artifact through the cavernous and supraclinoid carotid, limiting evaluation. At least moderate if not severe right cavernous and proximal supraclinoid ICA stenosis is suspected, however motion limits characterization. Mild cavernous carotid stenosis is also suspected on the left.  Right M1 segment is patent with at most mild narrowing in its midportion. M2 divisions are patent at their origins, however  there is occlusion of the superior M2 division approximately 5 mm beyond its origin. Left M1 segment is patent without stenosis. Moderate left MCA branch vessel irregularity is present. A1 segments are patent with mild right greater than left vessel irregularity but no high-grade stenosis. Mild-to-moderate ACA branch vessel irregularity is present. No gross intracranial aneurysm is identified.  IMPRESSION: 1. Moderate-sized, acute right MCA infarct. 2. Mild chronic small vessel ischemic disease and cerebral atrophy. Scattered, VIII cerebral micro hemorrhages. 3. Moderately to severely motion degraded head MRA. Right M2 superior division occlusion just beyond its origin. 4. 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    Electronically Signed   By: Logan Bores   On: 10/12/2013 19:55    EKG: Independently reviewed. Normal sinus rhythm with poor R-wave progression.  Assessment/Plan Principal Problem:   CVA (cerebral vascular accident) Active Problems:   Hypertension   Hyperlipidemia   Type 2 diabetes mellitus   CVA (cerebral infarction)   1. Acute CVA - patient has been placed on neurochecks and swallow evaluation. Check 2-D echo carotid Doppler and hemoglobin A1c and lipid panel. Physical therapy consult. Aspirin. 2. Tobacco abuse - patient advised to quit smoking. 3. Hypertension - continue home medications. Will not get too aggressive to correct blood pressure due to acute stroke. 4. Diabetes mellitus type 2 - hold metformin while inpatient. Closely follow CBGs with sliding-scale. Continue other home medications.  Chest x-ray is pending.  Code Status: Full code.  Family Communication: None.  Disposition Plan: Admit to inpatient.    Tahra Hitzeman N. [facility] Pager [phone].  If 7PM-7AM, please contact night-coverage [email or web address] Password [email or web address] 10/12/2013, 9:19 PM

## 2013-10-12 NOTE — ED Notes (Signed)
Attempted to call report

## 2013-10-13 ENCOUNTER — Encounter (HOSPITAL_COMMUNITY): Payer: Self-pay | Admitting: General Practice

## 2013-10-13 DIAGNOSIS — I10 Essential (primary) hypertension: Secondary | ICD-10-CM

## 2013-10-13 DIAGNOSIS — I639 Cerebral infarction, unspecified: Secondary | ICD-10-CM

## 2013-10-13 DIAGNOSIS — I519 Heart disease, unspecified: Secondary | ICD-10-CM

## 2013-10-13 DIAGNOSIS — E785 Hyperlipidemia, unspecified: Secondary | ICD-10-CM

## 2013-10-13 DIAGNOSIS — E1149 Type 2 diabetes mellitus with other diabetic neurological complication: Secondary | ICD-10-CM

## 2013-10-13 LAB — LIPID PANEL
Cholesterol: 281 mg/dL — ABNORMAL HIGH (ref 0–200)
HDL: 59 mg/dL (ref >39)
LDL CALC: 194 mg/dL — AB (ref 0–99)
Total CHOL/HDL Ratio: 4.8 RATIO
Triglycerides: 141 mg/dL (ref <150)
VLDL: 28 mg/dL (ref 0–40)

## 2013-10-13 LAB — COMPREHENSIVE METABOLIC PANEL
ALT: 7 U/L (ref 0–35)
AST: 13 U/L (ref 0–37)
Albumin: 3.4 g/dL — ABNORMAL LOW (ref 3.5–5.2)
Alkaline Phosphatase: 115 U/L (ref 39–117)
Anion gap: 12 (ref 5–15)
BILIRUBIN TOTAL: 0.3 mg/dL (ref 0.3–1.2)
BUN: 12 mg/dL (ref 6–23)
CO2: 25 meq/L (ref 19–32)
CREATININE: 0.75 mg/dL (ref 0.50–1.10)
Calcium: 9.4 mg/dL (ref 8.4–10.5)
Chloride: 101 mEq/L (ref 96–112)
GFR calc Af Amer: >90 mL/min (ref >90)
GFR, EST NON AFRICAN AMERICAN: 81 mL/min — AB (ref >90)
Glucose, Bld: 138 mg/dL — ABNORMAL HIGH (ref 70–99)
Potassium: 4 mEq/L (ref 3.7–5.3)
SODIUM: 138 meq/L (ref 137–147)
Total Protein: 6.9 g/dL (ref 6.0–8.3)

## 2013-10-13 LAB — GLUCOSE, CAPILLARY
GLUCOSE-CAPILLARY: 189 mg/dL — AB (ref 70–99)
Glucose-Capillary: 172 mg/dL — ABNORMAL HIGH (ref 70–99)
Glucose-Capillary: 137 mg/dL — ABNORMAL HIGH (ref 70–99)
Glucose-Capillary: 270 mg/dL — ABNORMAL HIGH (ref 70–99)

## 2013-10-13 LAB — CBC WITH DIFFERENTIAL/PLATELET
BASOS ABS: 0.1 10*3/uL (ref 0.0–0.1)
Basophils Relative: 1 % (ref 0–1)
EOS ABS: 0.1 10*3/uL (ref 0.0–0.7)
EOS PCT: 1 % (ref 0–5)
HCT: 43.3 % (ref 36.0–46.0)
Hemoglobin: 14.3 g/dL (ref 12.0–15.0)
Lymphocytes Relative: 48 % — ABNORMAL HIGH (ref 12–46)
Lymphs Abs: 3.6 10*3/uL (ref 0.7–4.0)
MCH: 31.1 pg (ref 26.0–34.0)
MCHC: 33 g/dL (ref 30.0–36.0)
MCV: 94.1 fL (ref 78.0–100.0)
Monocytes Absolute: 0.3 10*3/uL (ref 0.1–1.0)
Monocytes Relative: 4 % (ref 3–12)
Neutro Abs: 3.3 10*3/uL (ref 1.7–7.7)
Neutrophils Relative %: 46 % (ref 43–77)
PLATELETS: 252 10*3/uL (ref 150–400)
RBC: 4.6 MIL/uL (ref 3.87–5.11)
RDW: 14.2 % (ref 11.5–15.5)
WBC: 7.3 10*3/uL (ref 4.0–10.5)

## 2013-10-13 LAB — HEMOGLOBIN A1C
Hgb A1c MFr Bld: 10.8 % — ABNORMAL HIGH (ref <5.7)
Mean Plasma Glucose: 263 mg/dL — ABNORMAL HIGH (ref <117)

## 2013-10-13 MED ORDER — HYDRALAZINE HCL 20 MG/ML IJ SOLN: 10.0000 mg | Freq: Four times a day (QID) | INTRAMUSCULAR | Status: DC | PRN

## 2013-10-13 MED ORDER — INFLUENZA VAC SPLIT QUAD 0.5 ML IM SUSY
0.5000 mL | PREFILLED_SYRINGE | INTRAMUSCULAR | Status: AC
  Administered 2013-10-14: 0.5 mL via INTRAMUSCULAR
  Filled 2013-10-13: qty 0.5

## 2013-10-13 MED ORDER — ATORVASTATIN CALCIUM 80 MG PO TABS
80.0000 mg | ORAL_TABLET | Freq: Every day | ORAL | Status: DC
  Administered 2013-10-14 – 2013-10-15 (×2): 80 mg via ORAL
  Filled 2013-10-13 (×3): qty 1

## 2013-10-13 NOTE — Evaluation (Signed)
Speech Language Pathology Evaluation Patient Details Name: Audrey Carr MRN: [id number] DOB: 1940-12-26 Today's Date: 10/13/2013 Time: 1020-1100 SLP Time Calculation (min): 40 min  Problem List:  Patient Active Problem List   Diagnosis Date Noted  . CVA (cerebral vascular accident) 10/12/2013  . Hypertension 10/12/2013  . Hyperlipidemia 10/12/2013  . Type 2 diabetes mellitus 10/12/2013  . CVA (cerebral infarction) 10/12/2013   Past Medical History:  Past Medical History  Diagnosis Date  . Diabetes mellitus without complication   . Stroke 10/12/2013   Past Surgical History:  Past Surgical History  Procedure Laterality Date  . Abdominal hysterectomy     HPI:  Audrey Carr is a 73 y.o. female with history of hypertension, diabetes mellitus and ongoing tobacco abuse was brought to the ER after patient was noticed to have persistent left facial droop. Patient was initially noted to have a facial droop 9/30 by patient's neighbor on 8 PM. Since patient's symptoms were persistent patient was brought to the ER. In the ER patient had CT head followed by MRI brain which showed acute right MCA stroke    Assessment / Plan / Recommendation Clinical Impression  Patient exhibits a moderate dysarhria with decreased vocal intensity and impresice consonants due to left labial and lingual weakness, as well as cognitive changes.  Pt has a flat affect, decreased initiation, decreased awareness, with deficits noted in short-term memory, reasoning and judgement.  Pt would benefit from ALF or SNF with f/u SLP.    SLP Assessment  Patient needs continued Speech Lanaguage Pathology Services    Follow Up Recommendations  Skilled Nursing facility;24 hour supervision/assistance (Versus ALF)    Frequency and Duration min 2x/week  2 weeks   Pertinent Vitals/Pain Pain Assessment: No/denies pain   SLP Goals  Patient/Family Stated Goal: "Ill do whatever you say." Potential to Achieve Goals:  Good  SLP Evaluation Prior Functioning  Cognitive/Linguistic Baseline: Within functional limits Type of Home: House  Lives With: Alone (Husband in ALF ) Vocation: Retired   Associate Professor  Overall Cognitive Status: Impaired/Different from baseline Arousal/Alertness: Awake/alert Orientation Level: Oriented X4 Attention: Alternating Alternating Attention: Impaired Alternating Attention Impairment: Verbal complex Memory: Impaired Memory Impairment: Decreased recall of new information;Decreased short term memory Decreased Short Term Memory: Verbal complex Awareness: Impaired Awareness Impairment: Anticipatory impairment Problem Solving: Impaired Problem Solving Impairment: Verbal basic Executive Function: Reasoning;Initiating;Decision Making Reasoning: Impaired Reasoning Impairment: Verbal basic Decision Making: Impaired Decision Making Impairment: Verbal basic Initiating: Impaired Initiating Impairment: Verbal basic Behaviors:  (flat affect) Safety/Judgment: Impaired    Comprehension  Auditory Comprehension Overall Auditory Comprehension: Appears within functional limits for tasks assessed Visual Recognition/Discrimination Discrimination: Within Function Limits    Expression Expression Primary Mode of Expression: Verbal Verbal Expression Overall Verbal Expression: Appears within functional limits for tasks assessed Initiation: Impaired Level of Generative/Spontaneous Verbalization: Conversation Repetition: No impairment Naming: No impairment Pragmatics: Impairment Impairments: Abnormal affect;Monotone;Interpretation of nonverbal communication;Topic appropriateness Written Expression Written Expression: Not tested   Oral / Motor Oral Motor/Sensory Function Overall Oral Motor/Sensory Function: Impaired Labial ROM: Reduced left Labial Symmetry: Abnormal symmetry left Labial Strength: Reduced Labial Sensation: Reduced Lingual ROM: Reduced left Lingual Symmetry: Abnormal  symmetry left Lingual Strength: Reduced Facial ROM: Reduced left Facial Symmetry: Left droop Facial Strength: Reduced Motor Speech Overall Motor Speech: Impaired Respiration: Impaired Level of Impairment: Conversation Phonation: Low vocal intensity Resonance: Within functional limits Articulation: Impaired Level of Impairment: Conversation Intelligibility: Intelligibility reduced Word: 75-100% accurate Phrase: 75-100% accurate Sentence: 75-100% accurate Conversation: 50-74% accurate Motor Planning:  Witnin functional limits Effective Techniques: Increased vocal intensity;Over-articulate   GO     Quinn Axe T 10/13/2013, 12:33 PM

## 2013-10-13 NOTE — Evaluation (Signed)
Occupational Therapy Evaluation Patient Details Name: Audrey Carr MRN: [id number] DOB: October 23, 1940 Today's Date: 10/13/2013    History of Present Illness Audrey Carr is a 73 y.o. female with history of hypertension, diabetes mellitus and ongoing tobacco abuse was brought to the ER after patient was noticed to have persistent left facial droop. Patient was initially noted to have a facial droop 9/30 by patient's neighbor on 8 PM. Since patient's symptoms were persistent patient was brought to the ER. In the ER patient had CT head followed by MRI brain which showed acute right MCA stroke   Clinical Impression   Overall pt is moving well, although did have one loss of balance during session with min cues to regain balance. Pt with incontinence during session and required cues from OT to notice. Pt will benefit from continued acute OT Services to address below problem list. Pt very slow to process information and increased time to sequence tasks. Recommend SNF with 24/7 supervision/assist for d/c planning.    Follow Up Recommendations  SNF;Supervision/Assistance - 24 hour    Equipment Recommendations  None recommended by OT    Recommendations for Other Services       Precautions / Restrictions Precautions Precautions: Fall      Mobility Bed Mobility               General bed mobility comments: not assessed- pt up in chair  Transfers Overall transfer level: Needs assistance   Transfers: Sit to/from Stand Sit to Stand: Supervision              Balance                                            ADL Overall ADL's : Needs assistance/impaired     Grooming: Supervision/safety;Wash/dry hands;Wash/dry face;Brushing hair;Oral care               Lower Body Dressing: Set up;Supervision/safety;Sit to/from stand   Toilet Transfer: Min guard;Ambulation;Comfort height toilet   Toileting- Clothing Manipulation and Hygiene: Supervision/safety;Sit  to/from stand       Functional mobility during ADLs: Min guard General ADL Comments: Pt with LOB posteriorly x1 during toilet transfer.  Only required min cues to recover balance. Pt with urinary incontinence while standing at sink.  Pt unaware of incontinence until made aware by therapist.  Able to sequence grooming tasks at sink but required cues for sequencing for toileting hygiene.  ("I don't know whether to sit or stand" )     Vision                     Perception     Praxis      Pertinent Vitals/Pain Pain Assessment: No/denies pain     Hand Dominance     Extremity/Trunk Assessment Upper Extremity Assessment Upper Extremity Assessment: Overall WFL for tasks assessed           Communication Communication Communication:  (slow to process)   Cognition Arousal/Alertness: Awake/alert Behavior During Therapy: Flat affect Overall Cognitive Status: Impaired/Different from baseline Area of Impairment: Orientation;Memory;Safety/judgement Orientation Level: Time   Memory: Decreased short-term memory   Safety/Judgement: Decreased awareness of safety     General Comments: Pt with urinary incontinence while standing at sink for grooming and did not seem to notice until cued by therapist.    General Comments  Exercises       Shoulder Instructions      Home Living Family/patient expects to be discharged to:: Private residence Living Arrangements: Alone   Type of Home: House Home Access: Stairs to enter Technical brewer of Steps: 2   Home Layout: One level     Bathroom Shower/Tub: Teacher, early years/pre: Standard     Home Equipment: None      Lives With: Alone (husband in ALF)    Prior Functioning/Environment Level of Independence: Independent             OT Diagnosis: Generalized weakness;Cognitive deficits   OT Problem List: Decreased activity tolerance;Impaired balance (sitting and/or standing);Decreased  cognition;Decreased strength;Decreased safety awareness   OT Treatment/Interventions: Self-care/ADL training;DME and/or AE instruction;Patient/family education;Balance training;Therapeutic activities;Cognitive remediation/compensation    OT Goals(Current goals can be found in the care plan section) Acute Rehab OT Goals Patient Stated Goal: return home and renew drivers license OT Goal Formulation: With patient Time For Goal Achievement: 10/20/13 Potential to Achieve Goals: Good  OT Frequency: Min 2X/week   Barriers to D/C: Decreased caregiver support          Co-evaluation              End of Session Equipment Utilized During Treatment: Gait belt Nurse Communication: Mobility status (pt incontinent during session)  Activity Tolerance: Patient tolerated treatment well Patient left: in chair;with call bell/phone within reach;with nursing/sitter in room   Time: 1050-1133 OT Time Calculation (min): 43 min Charges:  OT General Charges $OT Visit: 1 Procedure OT Evaluation $Initial OT Evaluation Tier I: 1 Procedure OT Treatments $Self Care/Home Management : 23-37 mins $Therapeutic Activity: 8-22 mins G-Codes:    Darrol Jump 10/13/2013, 3:10 PM  10/13/2013 Darrol Jump OTR/L Pager [phone] Office [phone]

## 2013-10-13 NOTE — Progress Notes (Addendum)
STROKE TEAM PROGRESS NOTE   HISTORY Audrey Carr is an 73 y.o. female history diabetes mellitus, hypertension and hyperlipidemia presenting with new onset left lower facial droop which was first noticed about 8 PM last night 10/11/2013. Patient has also developed slurring of speech. She's had no difficulty with swallowing. She said no left upper normal left lower extremity weakness no numbness. Has no previous history of stroke or TIA. She has not been on antiplatelet therapy. MRI of her brain showed moderate size acute right MCA infarction. 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 NIH stroke score was 3. Patient was not administered TPA secondary to delay in arrival. She was admitted for further evaluation and treatment.   SUBJECTIVE (INTERVAL HISTORY) No family is at the bedside.  Overall she feels her condition is stable. But still has significant left facial droop and slurry speech, no extremity weakness.    OBJECTIVE Temp:  [97.6 F (36.4 C)-98.1 F (36.7 C)] 97.9 F (36.6 C) (10/02 0600) Pulse Rate:  [65-77] 68 (10/02 0600) Cardiac Rhythm:  [-] Normal sinus rhythm (10/02 0800) Resp:  [12-21] 16 (10/02 0600) BP: (144-194)/(68-104) 152/70 mmHg (10/02 0600) SpO2:  [96 %-100 %] 100 % (10/02 0600) Weight:  [58.196 kg (128 lb 4.8 oz)] 58.196 kg (128 lb 4.8 oz) (10/01 2205)   Recent Labs Lab 10/12/13 2211 10/13/13 0757 10/13/13 1138  GLUCAP 132* 172* 189*    Recent Labs Lab 10/12/13 1615 10/12/13 1625 10/13/13 0446  NA 144 141 138  K 4.3 4.1 4.0  CL 103 105 101  CO2 25  --  25  GLUCOSE 174* 175* 138*  BUN 13 14 12   CREATININE 0.72 0.70 0.75  CALCIUM 10.0  --  9.4    Recent Labs Lab 10/12/13 1615 10/13/13 0446  AST 15 13  ALT 9 7  ALKPHOS 131* 115  BILITOT 0.3 0.3  PROT 8.1 6.9  ALBUMIN 4.1 3.4*    Recent Labs Lab 10/12/13 1615 10/12/13 1625 10/13/13 0446  WBC 7.8  --   7.3  NEUTROABS 4.4  --  3.3  HGB 15.3* 16.3* 14.3  HCT 44.8 48.0* 43.3  MCV 92.4  --  94.1  PLT 238  --  252   No results found for this basename: CKTOTAL, CKMB, CKMBINDEX, TROPONINI,  in the last 168 hours  Recent Labs  10/12/13 1615  LABPROT 12.6  INR 0.94    Recent Labs  10/12/13 1743  COLORURINE YELLOW  LABSPEC 1.030  PHURINE 6.5  GLUCOSEU >1000*  HGBUR NEGATIVE  BILIRUBINUR NEGATIVE  KETONESUR NEGATIVE  PROTEINUR NEGATIVE  UROBILINOGEN 0.2  NITRITE NEGATIVE  LEUKOCYTESUR NEGATIVE       Component Value Date/Time   CHOL 281* 10/13/2013 0500   TRIG 141 10/13/2013 0500   HDL 59 10/13/2013 0500   CHOLHDL 4.8 10/13/2013 0500   VLDL 28 10/13/2013 0500   LDLCALC 194* 10/13/2013 0500   No results found for this basename: HGBA1C      Component Value Date/Time   LABOPIA NONE DETECTED 10/12/2013 1743   COCAINSCRNUR NONE DETECTED 10/12/2013 1743   LABBENZ NONE DETECTED 10/12/2013 1743   AMPHETMU NONE DETECTED 10/12/2013 1743   THCU NONE DETECTED 10/12/2013 1743   LABBARB NONE DETECTED 10/12/2013 1743     Recent Labs Lab 10/12/13 1615  ETH <11    Dg Chest 2 View 10/13/2013   No active cardiopulmonary disease.     Ct Head Wo Contrast  10/12/2013   1. There is no acute intracranial hemorrhage nor objective evidence of acute ischemic change. 2. There is decreased density in the deep white matter of both cerebral hemispheres, greatest in the right frontal region, consistent with chronic small vessel ischemic change.   Mri & Mra Brain Wo Contrast 10/12/2013    1. Moderate-sized, acute right MCA infarct. 2. Mild chronic small vessel ischemic disease and cerebral atrophy. Scattered, VIII cerebral micro hemorrhages. 3. Moderately to severely motion degraded head MRA. Right M2 superior division occlusion just beyond its origin. 4. 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     2D echo - Left ventricle: The cavity size was normal. Wall thickness  was normal. Systolic function was normal. The estimated ejection fraction was in the range of 60% to 65%. Doppler parameters are consistent with abnormal left ventricular relaxation (grade 1 diastolic dysfunction).  CUS - pending  PHYSICAL EXAM  Temp:  [97.6 F (36.4 C)-98.6 F (37 C)] 98.1 F (36.7 C) (10/02 1950) Pulse Rate:  [68-74] 74 (10/02 1950) Resp:  [16] 16 (10/02 1950) BP: (131-183)/(45-108) 131/54 mmHg (10/02 1950) SpO2:  [96 %-100 %] 99 % (10/02 1950) Weight:  [128 lb 4.8 oz (58.196 kg)] 128 lb 4.8 oz (58.196 kg) (10/01 2205)  General - Well nourished, well developed, in no apparent distress.  Ophthalmologic - not able to see through.  Cardiovascular - Regular rate and rhythm with no murmur.  Mental Status -  Level of arousal and orientation to time, place, and person were intact. Language including expression, naming, repetition, comprehension was assessed and found intact, mild dysarthria.  Cranial Nerves II - XII - II - Visual field intact OU. III, IV, VI - Extraocular movements intact. V - Facial sensation intact bilaterally. VII - Facial movement intact bilaterally. VIII - Hearing & vestibular intact bilaterally. X - Palate elevates symmetrically, but mild dysarthria. XI - Chin turning & shoulder shrug intact bilaterally. XII - Tongue protrusion intact.  Motor Strength - The patient's strength was normal in all extremities and pronator drift was absent.  Bulk was normal and fasciculations were absent.   Motor Tone - Muscle tone was assessed at the neck and appendages and was normal.  Reflexes - The patient's reflexes were normal in all extremities and she had no pathological reflexes.  Sensory - Light touch, temperature/pinprick were assessed and were normal.    Coordination - The patient had normal movements in the hands and feet with no ataxia or dysmetria.  Tremor was absent.  Gait and Station - not tested   ASSESSMENT/PLAN  Audrey Carr is a 73 y.o. female with history of diabetes mellitus, hypertension and hyperlipidemia presenting with new onset left lower facial droop. She did not receive IV t-PA due to delay in arrival. MRI imaging confirms a right MCA infarct.   Stroke:  right MCA infarct, etiology could be large vessel atherosclerosis but embolic can not be ruled out, source unknown.  MRI  R MCA infarct  MRA  small vessel disease, microhemorrhages, R M2 occlusion, mod to severe R ICA disease  Carotid Doppler  pending   2D Echo  unremarkable  TEE to look for embolic source. Arranged with [place] for Monday. If positive for PFO (patent foramen ovale), check bilateral lower extremity venous dopplers to rule out DVT as possible source of stroke. (I have made patient NPO after midnight tonight).  If TEE negative, a [facility] electrophysiologist will  consult and consider placement of an implantable loop recorder to evaluate for atrial fibrillation as etiology of stroke. Dr. Leonie Man will explain and discuss with patient tomorrow.   no antithrombotics prior to admission, now on aspirin 325 mg orally every day  Lovenox 40 mg sq daily for VTE prophylaxis  Carb Control thin liquids.   OOB with assistance  Therapy recommendations:  SNF  Ongoing aggressive risk factor management  Risk factor education  Disposition:  skilled nursing facility   Hypertension   Permissive hypertension <220/120 for 24-48 hours and then gradually normalize within 5-7 days  BP goal long term normotensive BP 131-197/58-108 past 24h (10/13/2013 @ 12:15 PM)  Stable  Hyperlipidemia  Home meds:  lipitor 40, increased to 80 in hospital.  LDL 194, not on the goal < 70  Need aggressive control.  Continue statin at discharge  Diabetes  HgbA1c 10.8, Goal < 7.0  Uncontrolled  Tobacco abuse - smoking cessation counseling provided - pt is going to try to quit  Other Stroke Risk  Factors Advanced age Cigarette smoker, advised to stop smoking   Family hx stroke (mother)  Hospital day # 1  Burnetta Sabin, MSN, RN, ANVP-BC, ANP-BC, [facility] Pager: [phone] 10/13/2013 3:38 PM   I, the attending vascular neurologist, have personally obtained a history, examined the patient, evaluated laboratory data, individually viewed imaging studies, and formulated the assessment and plan of care.  I have made any additions or clarifications directly to the above note and agree with the findings and plan as currently documented.   Rosalin Hawking, MD PhD Stroke Neurology 10/13/2013 9:14 PM   To contact Stroke Continuity provider, please refer to [email or web address] After hours, contact General Neurology

## 2013-10-13 NOTE — Progress Notes (Signed)
  Echocardiogram 2D Echocardiogram has been performed.  Audrey Carr 10/13/2013, 2:19 PM

## 2013-10-13 NOTE — Progress Notes (Signed)
Patient Demographics  Audrey Carr, is a 73 y.o. female, DOB - 1940-09-22, NWG:[id number]  Admit date - 10/12/2013   Admitting Physician Rise Patience, MD  Outpatient Primary MD for the patient is PROVIDER NOT [place]  LOS - 1   Chief Complaint  Patient presents with  . Facial Droop        Subjective:   Audrey Carr today has, No headache, No chest pain, No abdominal pain - No Nausea, No new weakness tingling or numbness, No Cough - SOB.    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 Pending echogram, carotid duplex, A1c.   2. Type 2 diabetes mellitus. A1c pending, continue oral hypoglycemic agent which is glipizide along with sliding scale. Monitor CBGs the   No results found for this basename: HGBA1C  \\ CBG (last 3)   Recent Labs  10/12/13 2211 10/13/13 0757  GLUCAP 132* 172*     3. Essential hypertension. Discontinue Norvasc to allow for permissive hypertension, as needed hydralazine.    4. Dyslipidemia. Have doubled statin dose for better LDL control in the light of acute CVA.      5. History of smoking. Counseled to quit.     Code Status: Full  Family Communication: none present  Disposition Plan: likely SNF   Procedures CT head, MRI/MRA brain, 2-D echogram   Consults Neuro   Medications  Scheduled Meds: . aspirin  300 mg Rectal Daily   Or  . aspirin  325 mg Oral Daily  . atorvastatin  40 mg Oral Daily  . enoxaparin (LOVENOX) injection  40 mg Subcutaneous Q24H  . escitalopram  10 mg Oral Daily  . glipiZIDE  10 mg Oral Q breakfast  . insulin aspart  0-9 Units  Subcutaneous TID WC   Continuous Infusions:  PRN Meds:.hydrALAZINE, senna-docusate  DVT Prophylaxis  Lovenox   Lab Results  Component Value Date   PLT 252 10/13/2013    Antibiotics     Anti-infectives   None          Objective:   Filed Vitals:   10/13/13 0000 10/13/13 0200 10/13/13 0400 10/13/13 0600  BP: 168/74 144/78 167/82 152/70  Pulse: 72 70 70 68  Temp:    97.9 F (36.6 C)  TempSrc:    Oral  Resp:    16  Height:      Weight:      SpO2: 100% 100% 100% 100%    Wt Readings from Last 3 Encounters:  10/12/13 58.196 kg (128 lb 4.8 oz)     Intake/Output Summary (Last 24 hours) at 10/13/13 1048 Last data filed at 10/13/13 0900  Gross per 24 hour  Intake    240 ml  Output      0 ml  Net    240 ml     Physical Exam  Awake Alert, Oriented X 3, L sided facial droop, mild L sided weakness, Normal affect [place].AT,PERRAL Supple Neck,No JVD, No cervical lymphadenopathy appriciated.  Symmetrical Chest wall movement, Good air movement bilaterally, CTAB RRR,No Gallops,Rubs or  new Murmurs, No Parasternal Heave +ve B.Sounds, Abd Soft, No tenderness, No organomegaly appriciated, No rebound - guarding or rigidity. No Cyanosis, Clubbing or edema, No new Rash or bruise      Data Review   Micro Results No results found for this or any previous visit (from the past 240 hour(s)).  Radiology Reports Dg Chest 2 View  10/13/2013   CLINICAL DATA:  Stroke.  EXAM: CHEST  2 VIEW  COMPARISON:  None.  FINDINGS: The heart size and mediastinal contours are within normal limits. Both lungs are clear. The visualized skeletal structures are unremarkable. Calcified and tortuous aorta.  IMPRESSION: No active cardiopulmonary disease.   Electronically Signed   By: Lucienne Capers M.D.   On: 10/13/2013 01:23   Ct Head Wo Contrast  10/12/2013   CLINICAL DATA:  New onset of facial droop ; no history of CNS abnormality  EXAM: CT HEAD WITHOUT CONTRAST  TECHNIQUE: Contiguous axial images were  obtained from the base of the skull through the vertex without intravenous contrast.  COMPARISON:  None.  FINDINGS: There is mild diffuse cerebral and cerebellar atrophy with compensatory ventriculomegaly. There is decreased density in the deep white matter of both cerebral hemispheres. This is greater on the right in the frontal region that elsewhere. There are basal ganglia calcifications bilaterally. There is no acute intracranial hemorrhage. The cerebellum and brainstem are unremarkable.  The observed paranasal sinuses and mastoid air cells are clear. The middle ear cavities are well pneumatized. The internal auditory canals are unremarkable.  IMPRESSION: 1. There is no acute intracranial hemorrhage nor objective evidence of acute ischemic change. 2. There is decreased density in the deep white matter of both cerebral hemispheres, greatest in the right frontal region, consistent with chronic small vessel ischemic change. If the patient's clinical findings do not reflect a typical Bell's palsy, MRI of the brain now may be useful.   Electronically Signed   By: David  Martinique   On: 10/12/2013 15:35   Mr Jodene Nam Head Wo Contrast  10/12/2013   CLINICAL DATA:  Left-sided facial droop since last night.  EXAM: MRI HEAD WITHOUT CONTRAST  MRA HEAD WITHOUT CONTRAST  TECHNIQUE: Multiplanar, multiecho pulse sequences of the brain and surrounding structures were obtained without intravenous contrast. Angiographic images of the head were obtained using MRA technique without contrast.  COMPARISON:  Head CT 10/12/2013  FINDINGS: MRI HEAD FINDINGS  There is a moderate-sized acute infarct involving the right cerebral hemisphere predominantly in the white matter of the right frontal lobe and corona radiata in the MCA territory. There is no definite evidence of acute hemorrhage associated with the area of infarction. A few scattered remote microhemorrhages are noted in the cerebral hemispheres. Foci of T2 hyperintensity in the  subcortical and deep cerebral white matter and pons are nonspecific but compatible with mild chronic small vessel ischemic disease. There is mild generalized cerebral atrophy. There is no mass, midline shift, or extra-axial fluid collection.  Orbits are unremarkable. Paranasal sinuses and mastoid air cells are clear. Major intracranial vascular flow voids are preserved.  MRA HEAD FINDINGS  Images are moderately to severely degraded by motion artifact. Visualized distal vertebral arteries are patent with the right being dominant. There is mild, diffuse irregularity of the intracranial right vertebral artery with moderate stenosis distally. PICA origins are patent. Right AICA origin appears patent. Basilar artery is patent with evaluation for stenosis limited by extensive motion artifact in its proximal and mid portions. P1 segments are patent without evidence  of stenosis. Moderate bilateral PCA branch vessel irregular narrowing is present. Posterior communicating arteries are not clearly identified.  Proximal intracranial internal carotid arteries are patent without evidence of stenosis. There is severe motion artifact through the cavernous and supraclinoid carotid, limiting evaluation. At least moderate if not severe right cavernous and proximal supraclinoid ICA stenosis is suspected, however motion limits characterization. Mild cavernous carotid stenosis is also suspected on the left.  Right M1 segment is patent with at most mild narrowing in its midportion. M2 divisions are patent at their origins, however there is occlusion of the superior M2 division approximately 5 mm beyond its origin. Left M1 segment is patent without stenosis. Moderate left MCA branch vessel irregularity is present. A1 segments are patent with mild right greater than left vessel irregularity but no high-grade stenosis. Mild-to-moderate ACA branch vessel irregularity is present. No gross intracranial aneurysm is identified.  IMPRESSION: 1.  Moderate-sized, acute right MCA infarct. 2. Mild chronic small vessel ischemic disease and cerebral atrophy. Scattered, VIII cerebral micro hemorrhages. 3. Moderately to severely motion degraded head MRA. Right M2 superior division occlusion just beyond its origin. 4. 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   Electronically Signed   By: Logan Bores   On: 10/12/2013 19:55   Mr Brain Wo Contrast  10/12/2013   CLINICAL DATA:  Left-sided facial droop since last night.  EXAM: MRI HEAD WITHOUT CONTRAST  MRA HEAD WITHOUT CONTRAST  TECHNIQUE: Multiplanar, multiecho pulse sequences of the brain and surrounding structures were obtained without intravenous contrast. Angiographic images of the head were obtained using MRA technique without contrast.  COMPARISON:  Head CT 10/12/2013  FINDINGS: MRI HEAD FINDINGS  There is a moderate-sized acute infarct involving the right cerebral hemisphere predominantly in the white matter of the right frontal lobe and corona radiata in the MCA territory. There is no definite evidence of acute hemorrhage associated with the area of infarction. A few scattered remote microhemorrhages are noted in the cerebral hemispheres. Foci of T2 hyperintensity in the subcortical and deep cerebral white matter and pons are nonspecific but compatible with mild chronic small vessel ischemic disease. There is mild generalized cerebral atrophy. There is no mass, midline shift, or extra-axial fluid collection.  Orbits are unremarkable. Paranasal sinuses and mastoid air cells are clear. Major intracranial vascular flow voids are preserved.  MRA HEAD FINDINGS  Images are moderately to severely degraded by motion artifact. Visualized distal vertebral arteries are patent with the right being dominant. There is mild, diffuse irregularity of the intracranial right vertebral artery with moderate stenosis distally. PICA origins are patent. Right AICA origin appears patent. Basilar  artery is patent with evaluation for stenosis limited by extensive motion artifact in its proximal and mid portions. P1 segments are patent without evidence of stenosis. Moderate bilateral PCA branch vessel irregular narrowing is present. Posterior communicating arteries are not clearly identified.  Proximal intracranial internal carotid arteries are patent without evidence of stenosis. There is severe motion artifact through the cavernous and supraclinoid carotid, limiting evaluation. At least moderate if not severe right cavernous and proximal supraclinoid ICA stenosis is suspected, however motion limits characterization. Mild cavernous carotid stenosis is also suspected on the left.  Right M1 segment is patent with at most mild narrowing in its midportion. M2 divisions are patent at their origins, however there is occlusion of the superior M2 division approximately 5 mm beyond its origin. Left M1 segment is patent without stenosis. Moderate left MCA branch vessel irregularity is present. A1  segments are patent with mild right greater than left vessel irregularity but no high-grade stenosis. Mild-to-moderate ACA branch vessel irregularity is present. No gross intracranial aneurysm is identified.  IMPRESSION: 1. Moderate-sized, acute right MCA infarct. 2. Mild chronic small vessel ischemic disease and cerebral atrophy. Scattered, VIII cerebral micro hemorrhages. 3. Moderately to severely motion degraded head MRA. Right M2 superior division occlusion just beyond its origin. 4. 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   Electronically Signed   By: Logan Bores   On: 10/12/2013 19:55     CBC  Recent Labs Lab 10/12/13 1615 10/12/13 1625 10/13/13 0446  WBC 7.8  --  7.3  HGB 15.3* 16.3* 14.3  HCT 44.8 48.0* 43.3  PLT 238  --  252  MCV 92.4  --  94.1  MCH 31.5  --  31.1  MCHC 34.2  --  33.0  RDW 14.1  --  14.2  LYMPHSABS 3.0  --  3.6  MONOABS 0.3  --  0.3  EOSABS 0.1  --   0.1  BASOSABS 0.1  --  0.1    Chemistries   Recent Labs Lab 10/12/13 1615 10/12/13 1625 10/13/13 0446  NA 144 141 138  K 4.3 4.1 4.0  CL 103 105 101  CO2 25  --  25  GLUCOSE 174* 175* 138*  BUN 13 14 12   CREATININE 0.72 0.70 0.75  CALCIUM 10.0  --  9.4  AST 15  --  13  ALT 9  --  7  ALKPHOS 131*  --  115  BILITOT 0.3  --  0.3   ------------------------------------------------------------------------------------------------------------------ estimated creatinine clearance is 51 ml/min (by C-G formula based on Cr of 0.75). ------------------------------------------------------------------------------------------------------------------ No results found for this basename: HGBA1C,  in the last 72 hours ------------------------------------------------------------------------------------------------------------------  Recent Labs  10/13/13 0500  CHOL 281*  HDL 59  LDLCALC 194*  TRIG 141  CHOLHDL 4.8   ------------------------------------------------------------------------------------------------------------------ No results found for this basename: TSH, T4TOTAL, FREET3, T3FREE, THYROIDAB,  in the last 72 hours ------------------------------------------------------------------------------------------------------------------ No results found for this basename: VITAMINB12, FOLATE, FERRITIN, TIBC, IRON, RETICCTPCT,  in the last 72 hours  Coagulation profile  Recent Labs Lab 10/12/13 1615  INR 0.94    No results found for this basename: DDIMER,  in the last 72 hours  Cardiac Enzymes No results found for this basename: CK, CKMB, TROPONINI, MYOGLOBIN,  in the last 168 hours ------------------------------------------------------------------------------------------------------------------ No components found with this basename: POCBNP,      Time Spent in minutes  35   Audrey Carr K M.D on 10/13/2013 at 10:48 AM  Between 7am to 7pm - Pager - [phone]  After  7pm go to [email or web address] - password [email or web address]  And look for the night coverage person covering for me after hours  [facility] Office  [phone]   **Disclaimer: This note may have been dictated with voice recognition software. Similar sounding words can inadvertently be transcribed and this note may contain transcription errors which may not have been corrected upon publication of note.**

## 2013-10-13 NOTE — H&P (Signed)
Unable to finish history ,patient having speech eval  Unable to finish history patient having echo

## 2013-10-13 NOTE — Progress Notes (Signed)
Utilization review completed.  
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
## 2013-10-14 DIAGNOSIS — I63511 Cerebral infarction due to unspecified occlusion or stenosis of right middle cerebral artery: Secondary | ICD-10-CM | POA: Diagnosis not present

## 2013-10-14 LAB — GLUCOSE, CAPILLARY
Glucose-Capillary: 164 mg/dL — ABNORMAL HIGH (ref 70–99)
Glucose-Capillary: 226 mg/dL — ABNORMAL HIGH (ref 70–99)
Glucose-Capillary: 157 mg/dL — ABNORMAL HIGH (ref 70–99)
Glucose-Capillary: 131 mg/dL — ABNORMAL HIGH (ref 70–99)

## 2013-10-14 MED ORDER — ASPIRIN EC 81 MG PO TBEC
81.0000 mg | DELAYED_RELEASE_TABLET | Freq: Every day | ORAL | Status: DC
Start: 2013-10-15 — End: 2013-10-16
  Administered 2013-10-15 – 2013-10-16 (×2): 81 mg via ORAL
  Filled 2013-10-14 (×2): qty 1

## 2013-10-14 MED ORDER — CLOPIDOGREL BISULFATE 75 MG PO TABS
75.0000 mg | ORAL_TABLET | Freq: Every day | ORAL | Status: DC
  Administered 2013-10-14 – 2013-10-16 (×3): 75 mg via ORAL
  Filled 2013-10-14 (×3): qty 1

## 2013-10-14 NOTE — Progress Notes (Signed)
STROKE TEAM PROGRESS NOTE   HISTORY Audrey Carr is an 73 y.o. female history diabetes mellitus, hypertension and hyperlipidemia presenting with new onset left lower facial droop which was first noticed about 8 PM last night 10/11/2013. Patient has also developed slurring of speech. She's had no difficulty with swallowing. She said no left upper normal left lower extremity weakness no numbness. Has no previous history of stroke or TIA. She has not been on antiplatelet therapy. MRI of her brain showed moderate size acute right MCA infarction. 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 NIH stroke score was 3. Patient was not administered TPA secondary to delay in arrival. She was admitted for further evaluation and treatment.   SUBJECTIVE (INTERVAL HISTORY) No family is at the bedside.  Overall she feels her condition is stable. But still has significant left facial droop and slurry speech, no extremity weakness. No changes noted   OBJECTIVE Temp:  [98 F (36.7 C)-98.4 F (36.9 C)] 98.4 F (36.9 C) (10/03 1145) Pulse Rate:  [59-74] 59 (10/03 1145) Cardiac Rhythm:  [-] Normal sinus rhythm (10/03 0800) Resp:  [14-16] 16 (10/03 1145) BP: (131-164)/(45-74) 164/65 mmHg (10/03 1145) SpO2:  [99 %-100 %] 100 % (10/03 1145)   Recent Labs Lab 10/13/13 1138 10/13/13 1633 10/13/13 2202 10/14/13 0743 10/14/13 1144  GLUCAP 189* 137* 270* 164* 226*    Recent Labs Lab 10/12/13 1615 10/12/13 1625 10/13/13 0446  NA 144 141 138  K 4.3 4.1 4.0  CL 103 105 101  CO2 25  --  25  GLUCOSE 174* 175* 138*  BUN 13 14 12   CREATININE 0.72 0.70 0.75  CALCIUM 10.0  --  9.4    Recent Labs Lab 10/12/13 1615 10/13/13 0446  AST 15 13  ALT 9 7  ALKPHOS 131* 115  BILITOT 0.3 0.3  PROT 8.1 6.9  ALBUMIN 4.1 3.4*    Recent Labs Lab 10/12/13 1615 10/12/13 1625 10/13/13 0446  WBC 7.8  --  7.3  NEUTROABS 4.4  --   3.3  HGB 15.3* 16.3* 14.3  HCT 44.8 48.0* 43.3  MCV 92.4  --  94.1  PLT 238  --  252   No results found for this basename: CKTOTAL, CKMB, CKMBINDEX, TROPONINI,  in the last 168 hours  Recent Labs  10/12/13 1615  LABPROT 12.6  INR 0.94    Recent Labs  10/12/13 1743  COLORURINE YELLOW  LABSPEC 1.030  PHURINE 6.5  GLUCOSEU >1000*  HGBUR NEGATIVE  BILIRUBINUR NEGATIVE  KETONESUR NEGATIVE  PROTEINUR NEGATIVE  UROBILINOGEN 0.2  NITRITE NEGATIVE  LEUKOCYTESUR NEGATIVE       Component Value Date/Time   CHOL 281* 10/13/2013 0500   TRIG 141 10/13/2013 0500   HDL 59 10/13/2013 0500   CHOLHDL 4.8 10/13/2013 0500   VLDL 28 10/13/2013 0500   LDLCALC 194* 10/13/2013 0500   Lab Results  Component Value Date   HGBA1C 10.8* 10/13/2013      Component Value Date/Time   LABOPIA NONE DETECTED 10/12/2013 1743   COCAINSCRNUR NONE DETECTED 10/12/2013 1743   LABBENZ NONE DETECTED 10/12/2013 1743   AMPHETMU NONE DETECTED 10/12/2013 1743   THCU NONE DETECTED 10/12/2013 1743   LABBARB NONE DETECTED 10/12/2013 1743     Recent Labs Lab 10/12/13 1615  ETH <11    Dg Chest 2 View 10/13/2013   No active cardiopulmonary disease.     Ct Head Wo Contrast 10/12/2013   1.  There is no acute intracranial hemorrhage nor objective evidence of acute ischemic change. 2. There is decreased density in the deep white matter of both cerebral hemispheres, greatest in the right frontal region, consistent with chronic small vessel ischemic change.   Mri & Mra Brain Wo Contrast 10/12/2013    1. Moderate-sized, acute right MCA infarct. 2. Mild chronic small vessel ischemic disease and cerebral atrophy. Scattered, VIII cerebral micro hemorrhages. 3. Moderately to severely motion degraded head MRA. Right M2 superior division occlusion just beyond its origin. 4. 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     2D echo - Left ventricle: The cavity size was normal. Wall thickness was normal.  Systolic function was normal. The estimated ejection fraction was in the range of 60% to 65%. Doppler parameters are consistent with abnormal left ventricular relaxation (grade 1 diastolic dysfunction).  CUS -1-39% bilateral ICA stenosis PHYSICAL EXAM  Temp:  [98 F (36.7 C)-98.4 F (36.9 C)] 98.4 F (36.9 C) (10/03 1145) Pulse Rate:  [59-74] 59 (10/03 1145) Resp:  [14-16] 16 (10/03 1145) BP: (131-164)/(45-74) 164/65 mmHg (10/03 1145) SpO2:  [99 %-100 %] 100 % (10/03 1145)  General - Well nourished, well developed, in no apparent distress.  Ophthalmologic - not able to see through.  Cardiovascular - Regular rate and rhythm with no murmur.  Mental Status -  Level of arousal and orientation to time, place, and person were intact. Language including expression, naming, repetition, comprehension was assessed and found intact, mild dysarthria.  Cranial Nerves II - XII - II - Visual field intact OU. III, IV, VI - Extraocular movements intact. V - Facial sensation intact bilaterally. VII - Facial movement intact bilaterally. VIII - Hearing & vestibular intact bilaterally. X - Palate elevates symmetrically, but mild dysarthria. XI - Chin turning & shoulder shrug intact bilaterally. XII - Tongue protrusion intact.  Motor Strength - The patient's strength was normal in all extremities and pronator drift was absent.  Bulk was normal and fasciculations were absent.   Motor Tone - Muscle tone was assessed at the neck and appendages and was normal.  Reflexes - The patient's reflexes were normal in all extremities and she had no pathological reflexes.  Sensory - Light touch, temperature/pinprick were assessed and were normal.    Coordination - The patient had normal movements in the hands and feet with no ataxia or dysmetria.  Tremor was absent.  Gait and Station - not tested   ASSESSMENT/PLAN  Ms. MAHIRA Carr is a 73 y.o. female with history of diabetes mellitus, hypertension  and hyperlipidemia presenting with new onset left lower facial droop. She did not receive IV t-PA due to delay in arrival. MRI imaging confirms a right MCA infarct.    Stroke:  right MCA infarct, etiology could be large vessel atherosclerosis due to right cavernous ICA stenosis  MRI  R MCA infarct  MRA  small vessel disease, microhemorrhages, R M2 occlusion, mod to severe R ICA disease  Carotid Doppler  1-39 % ICA stenosis b/l  2D Echo  unremarkable  TEE not necessary   no antithrombotics prior to admission, now on aspirin 325 mg orally every day  Lovenox 40 mg sq daily for VTE prophylaxis  Carb Control thin liquids.   OOB with assistance  Therapy recommendations:  SNF  Ongoing aggressive risk factor management  Risk factor education  Disposition:  skilled nursing facility   Hypertension   Permissive hypertension <220/120 for 24-48 hours and then gradually normalize within 5-7  days  BP goal long term normotensive BP 131-197/58-108 past 24h (10/14/2013 @ 3:32 PM)  Stable  Hyperlipidemia  Home meds:  lipitor 40, increased to 80 in hospital.  LDL 194, not on the goal < 70  Need aggressive control.  Continue statin at discharge  Diabetes  HgbA1c 10.8, Goal < 7.0  Uncontrolled  Tobacco abuse - smoking cessation counseling provided - pt is going to try to quit  Other Stroke Risk Factors Advanced age Cigarette smoker, advised to stop smoking   Family hx stroke (mother)  Hospital day # 2 Recommend aspirin plus plavix x 3 months then Plavix alone and aggressive risk factor modification    I, the attending vascular neurologist, have personally obtained a history, examined the patient, evaluated laboratory data, individually viewed imaging studies, and formulated the assessment and plan of care.  I have made any additions or clarifications directly to the above note and agree with the findings and plan as currently documented.   Antony Contras, MD Stroke  Neurology 10/14/2013 3:32 PM   To contact Stroke Continuity provider, please refer to [email or web address] After hours, contact General Neurology

## 2013-10-14 NOTE — Progress Notes (Signed)
Patient Demographics  Audrey Carr, is a 73 y.o. female, DOB - Jan 20, 1940, AOZ:[id number]  Admit date - 10/12/2013   Admitting Physician Rise Patience, MD  Outpatient Primary MD for the patient is Jerlyn Ly, MD  LOS - 2   Chief Complaint  Patient presents with  . Facial Droop        Subjective:   Audrey Carr today has, No headache, No chest pain, No abdominal pain - No Nausea, No new weakness tingling or numbness, No Cough - SOB.    Assessment & Plan    1. R MCA CVA - noted MRI MRA findings, still has mild facial droop on the left but weakness mostly recovered, currently on aspirin, will increase the statin dose for better LDL control, neuro following, PT OT speech or going. Discussed with neurologist Dr. Erlinda Hong on 10-13-2013, he is arranged for TEE on Monday. Followed by possible loop recorder. Will monitor results.  Note 2-D echogram shows EF of 60% with chronic grade 1 diastolic CHF.   Carotid duplex is pending, TEE is pending for Monday.    2. Type 2 diabetes mellitus. A1c close to 11, continue oral hypoglycemic agent which is glipizide along with sliding scale. Monitor CBGs, we may need to increase glipizide upon discharge.   Lab Results  Component Value Date   HGBA1C 10.8* 10/13/2013   CBG (last 3)   Recent Labs  10/13/13 1633 10/13/13 2202 10/14/13 0743  GLUCAP 137* 270* 164*     3. Essential hypertension. Discontinue Norvasc to allow for permissive hypertension, as needed hydralazine.    4. Dyslipidemia. Have doubled statin dose for better LDL control in the light of acute CVA.    Lab Results  Component Value Date   CHOL 281* 10/13/2013   HDL 59 10/13/2013   LDLCALC 194* 10/13/2013   TRIG 141 10/13/2013   CHOLHDL 4.8 10/13/2013     5. History of smoking.  Counseled to quit.     Code Status: Full  Family Communication: none present  Disposition Plan: likely SNF , social work consulted note her husband is in a rehabilitation himself   Procedures CT head, MRI/MRA brain, 2-D echogram, carotid duplex pending, TEE for Monday   Consults Neuro   Medications  Scheduled Meds: . aspirin  325 mg Oral Daily  . atorvastatin  80 mg Oral q1800  . enoxaparin (LOVENOX) injection  40 mg Subcutaneous Q24H  . escitalopram  10 mg Oral Daily  . glipiZIDE  10 mg Oral Q breakfast  . insulin aspart  0-9 Units Subcutaneous TID WC   Continuous Infusions:  PRN Meds:.hydrALAZINE, senna-docusate  DVT Prophylaxis  Lovenox   Lab Results  Component Value Date   PLT 252 10/13/2013    Antibiotics     Anti-infectives   None          Objective:   Filed Vitals:   10/13/13 1615 10/13/13 2100 10/14/13 0000 10/14/13 0740  BP: 161/45 131/54 148/63 155/65  Pulse:  74 65 61  Temp:  98.1 F (36.7 C) 98 F (36.7 C) 98.2 F (36.8 C)  TempSrc:  Oral Oral Oral  Resp:  16 16 14   Height:      Weight:      SpO2:  99% 100% 100%    Wt Readings from Last 3 Encounters:  10/12/13 58.196 kg (128 lb 4.8 oz)     Intake/Output Summary (Last 24 hours) at 10/14/13 1001 Last data filed at 10/14/13 0900  Gross per 24 hour  Intake    360 ml  Output      0 ml  Net    360 ml     Physical Exam  Awake Alert, Oriented X 3, L sided facial droop, mild L sided weakness, Normal affect [place].AT,PERRAL Supple Neck,No JVD, No cervical lymphadenopathy appriciated.  Symmetrical Chest wall movement, Good air movement bilaterally, CTAB RRR,No Gallops,Rubs or new Murmurs, No Parasternal Heave +ve B.Sounds, Abd Soft, No tenderness, No organomegaly appriciated, No rebound - guarding or rigidity. No Cyanosis, Clubbing or edema, No new Rash or bruise      Data Review   Micro Results No results found for this or any previous visit (from the past 240  hour(s)).  Radiology Reports Dg Chest 2 View  10/13/2013   CLINICAL DATA:  Stroke.  EXAM: CHEST  2 VIEW  COMPARISON:  None.  FINDINGS: The heart size and mediastinal contours are within normal limits. Both lungs are clear. The visualized skeletal structures are unremarkable. Calcified and tortuous aorta.  IMPRESSION: No active cardiopulmonary disease.   Electronically Signed   By: Lucienne Capers M.D.   On: 10/13/2013 01:23   Ct Head Wo Contrast  10/12/2013   CLINICAL DATA:  New onset of facial droop ; no history of CNS abnormality  EXAM: CT HEAD WITHOUT CONTRAST  TECHNIQUE: Contiguous axial images were obtained from the base of the skull through the vertex without intravenous contrast.  COMPARISON:  None.  FINDINGS: There is mild diffuse cerebral and cerebellar atrophy with compensatory ventriculomegaly. There is decreased density in the deep white matter of both cerebral hemispheres. This is greater on the right in the frontal region that elsewhere. There are basal ganglia calcifications bilaterally. There is no acute intracranial hemorrhage. The cerebellum and brainstem are unremarkable.  The observed paranasal sinuses and mastoid air cells are clear. The middle ear cavities are well pneumatized. The internal auditory canals are unremarkable.  IMPRESSION: 1. There is no acute intracranial hemorrhage nor objective evidence of acute ischemic change. 2. There is decreased density in the deep white matter of both cerebral hemispheres, greatest in the right frontal region, consistent with chronic small vessel ischemic change. If the patient's clinical findings do not reflect a typical Bell's palsy, MRI of the brain now may be useful.   Electronically Signed   By: David  Martinique   On: 10/12/2013 15:35   Mr Jodene Nam Head Wo Contrast  10/12/2013   CLINICAL DATA:  Left-sided facial droop since last night.  EXAM: MRI HEAD WITHOUT CONTRAST  MRA HEAD WITHOUT CONTRAST  TECHNIQUE: Multiplanar, multiecho pulse sequences of  the brain and surrounding structures were obtained without intravenous contrast. Angiographic images of the head were obtained using MRA technique without contrast.  COMPARISON:  Head CT 10/12/2013  FINDINGS: MRI HEAD FINDINGS  There is a moderate-sized acute infarct involving the right cerebral hemisphere predominantly in the white matter of the right frontal lobe and corona radiata in the MCA territory. There is no definite evidence of acute hemorrhage associated with the area of infarction. A few scattered remote microhemorrhages are noted in the cerebral hemispheres. Foci of T2 hyperintensity in the subcortical and deep cerebral white matter and pons are nonspecific but compatible with mild chronic small vessel ischemic disease.  There is mild generalized cerebral atrophy. There is no mass, midline shift, or extra-axial fluid collection.  Orbits are unremarkable. Paranasal sinuses and mastoid air cells are clear. Major intracranial vascular flow voids are preserved.  MRA HEAD FINDINGS  Images are moderately to severely degraded by motion artifact. Visualized distal vertebral arteries are patent with the right being dominant. There is mild, diffuse irregularity of the intracranial right vertebral artery with moderate stenosis distally. PICA origins are patent. Right AICA origin appears patent. Basilar artery is patent with evaluation for stenosis limited by extensive motion artifact in its proximal and mid portions. P1 segments are patent without evidence of stenosis. Moderate bilateral PCA branch vessel irregular narrowing is present. Posterior communicating arteries are not clearly identified.  Proximal intracranial internal carotid arteries are patent without evidence of stenosis. There is severe motion artifact through the cavernous and supraclinoid carotid, limiting evaluation. At least moderate if not severe right cavernous and proximal supraclinoid ICA stenosis is suspected, however motion limits  characterization. Mild cavernous carotid stenosis is also suspected on the left.  Right M1 segment is patent with at most mild narrowing in its midportion. M2 divisions are patent at their origins, however there is occlusion of the superior M2 division approximately 5 mm beyond its origin. Left M1 segment is patent without stenosis. Moderate left MCA branch vessel irregularity is present. A1 segments are patent with mild right greater than left vessel irregularity but no high-grade stenosis. Mild-to-moderate ACA branch vessel irregularity is present. No gross intracranial aneurysm is identified.  IMPRESSION: 1. Moderate-sized, acute right MCA infarct. 2. Mild chronic small vessel ischemic disease and cerebral atrophy. Scattered, VIII cerebral micro hemorrhages. 3. Moderately to severely motion degraded head MRA. Right M2 superior division occlusion just beyond its origin. 4. 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   Electronically Signed   By: Logan Bores   On: 10/12/2013 19:55   Mr Brain Wo Contrast  10/12/2013   CLINICAL DATA:  Left-sided facial droop since last night.  EXAM: MRI HEAD WITHOUT CONTRAST  MRA HEAD WITHOUT CONTRAST  TECHNIQUE: Multiplanar, multiecho pulse sequences of the brain and surrounding structures were obtained without intravenous contrast. Angiographic images of the head were obtained using MRA technique without contrast.  COMPARISON:  Head CT 10/12/2013  FINDINGS: MRI HEAD FINDINGS  There is a moderate-sized acute infarct involving the right cerebral hemisphere predominantly in the white matter of the right frontal lobe and corona radiata in the MCA territory. There is no definite evidence of acute hemorrhage associated with the area of infarction. A few scattered remote microhemorrhages are noted in the cerebral hemispheres. Foci of T2 hyperintensity in the subcortical and deep cerebral white matter and pons are nonspecific but compatible with mild chronic  small vessel ischemic disease. There is mild generalized cerebral atrophy. There is no mass, midline shift, or extra-axial fluid collection.  Orbits are unremarkable. Paranasal sinuses and mastoid air cells are clear. Major intracranial vascular flow voids are preserved.  MRA HEAD FINDINGS  Images are moderately to severely degraded by motion artifact. Visualized distal vertebral arteries are patent with the right being dominant. There is mild, diffuse irregularity of the intracranial right vertebral artery with moderate stenosis distally. PICA origins are patent. Right AICA origin appears patent. Basilar artery is patent with evaluation for stenosis limited by extensive motion artifact in its proximal and mid portions. P1 segments are patent without evidence of stenosis. Moderate bilateral PCA branch vessel irregular narrowing is present. Posterior communicating arteries are  not clearly identified.  Proximal intracranial internal carotid arteries are patent without evidence of stenosis. There is severe motion artifact through the cavernous and supraclinoid carotid, limiting evaluation. At least moderate if not severe right cavernous and proximal supraclinoid ICA stenosis is suspected, however motion limits characterization. Mild cavernous carotid stenosis is also suspected on the left.  Right M1 segment is patent with at most mild narrowing in its midportion. M2 divisions are patent at their origins, however there is occlusion of the superior M2 division approximately 5 mm beyond its origin. Left M1 segment is patent without stenosis. Moderate left MCA branch vessel irregularity is present. A1 segments are patent with mild right greater than left vessel irregularity but no high-grade stenosis. Mild-to-moderate ACA branch vessel irregularity is present. No gross intracranial aneurysm is identified.  IMPRESSION: 1. Moderate-sized, acute right MCA infarct. 2. Mild chronic small vessel ischemic disease and cerebral  atrophy. Scattered, VIII cerebral micro hemorrhages. 3. Moderately to severely motion degraded head MRA. Right M2 superior division occlusion just beyond its origin. 4. 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   Electronically Signed   By: Logan Bores   On: 10/12/2013 19:55     CBC  Recent Labs Lab 10/12/13 1615 10/12/13 1625 10/13/13 0446  WBC 7.8  --  7.3  HGB 15.3* 16.3* 14.3  HCT 44.8 48.0* 43.3  PLT 238  --  252  MCV 92.4  --  94.1  MCH 31.5  --  31.1  MCHC 34.2  --  33.0  RDW 14.1  --  14.2  LYMPHSABS 3.0  --  3.6  MONOABS 0.3  --  0.3  EOSABS 0.1  --  0.1  BASOSABS 0.1  --  0.1    Chemistries   Recent Labs Lab 10/12/13 1615 10/12/13 1625 10/13/13 0446  NA 144 141 138  K 4.3 4.1 4.0  CL 103 105 101  CO2 25  --  25  GLUCOSE 174* 175* 138*  BUN 13 14 12   CREATININE 0.72 0.70 0.75  CALCIUM 10.0  --  9.4  AST 15  --  13  ALT 9  --  7  ALKPHOS 131*  --  115  BILITOT 0.3  --  0.3   ------------------------------------------------------------------------------------------------------------------ estimated creatinine clearance is 51 ml/min (by C-G formula based on Cr of 0.75). ------------------------------------------------------------------------------------------------------------------  Recent Labs  10/13/13 0446  HGBA1C 10.8*   ------------------------------------------------------------------------------------------------------------------  Recent Labs  10/13/13 0500  CHOL 281*  HDL 59  LDLCALC 194*  TRIG 141  CHOLHDL 4.8   ------------------------------------------------------------------------------------------------------------------ No results found for this basename: TSH, T4TOTAL, FREET3, T3FREE, THYROIDAB,  in the last 72 hours ------------------------------------------------------------------------------------------------------------------ No results found for this basename: VITAMINB12, FOLATE, FERRITIN,  TIBC, IRON, RETICCTPCT,  in the last 72 hours  Coagulation profile  Recent Labs Lab 10/12/13 1615  INR 0.94    No results found for this basename: DDIMER,  in the last 72 hours  Cardiac Enzymes No results found for this basename: CK, CKMB, TROPONINI, MYOGLOBIN,  in the last 168 hours ------------------------------------------------------------------------------------------------------------------ No components found with this basename: POCBNP,      Time Spent in minutes  35   Curtistine Pettitt K M.D on 10/14/2013 at 10:01 AM  Between 7am to 7pm - Pager - [phone]  After 7pm go to [email or web address] - password [email or web address]  And look for the night coverage person covering for me after hours  [facility] Office  [phone]   **Disclaimer: This note may have been dictated with voice  recognition software. Similar sounding words can inadvertently be transcribed and this note may contain transcription errors which may not have been corrected upon publication of note.**

## 2013-10-14 NOTE — Progress Notes (Signed)
VASCULAR LAB PRELIMINARY  PRELIMINARY  PRELIMINARY  PRELIMINARY  Carotid duplex  completed.    Preliminary report:  Bilateral:  1-39% ICA stenosis.  Vertebral artery flow is antegrade.      Audrey Carr, RVT 10/14/2013, 1:31 PM

## 2013-10-15 LAB — GLUCOSE, CAPILLARY
Glucose-Capillary: 161 mg/dL — ABNORMAL HIGH (ref 70–99)
Glucose-Capillary: 258 mg/dL — ABNORMAL HIGH (ref 70–99)
Glucose-Capillary: 101 mg/dL — ABNORMAL HIGH (ref 70–99)
Glucose-Capillary: 188 mg/dL — ABNORMAL HIGH (ref 70–99)

## 2013-10-15 MED ORDER — ATORVASTATIN CALCIUM 40 MG PO TABS: 80.0000 mg | ORAL_TABLET | Freq: Every day | ORAL | Status: AC

## 2013-10-15 MED ORDER — CLOPIDOGREL BISULFATE 75 MG PO TABS: 75.0000 mg | ORAL_TABLET | Freq: Every day | ORAL | Status: AC

## 2013-10-15 MED ORDER — INSULIN ASPART 100 UNIT/ML ~~LOC~~ SOLN: SUBCUTANEOUS | Status: AC

## 2013-10-15 NOTE — Progress Notes (Signed)
PATIENT DETAILS Name: Audrey Carr Age: 73 y.o. Sex: female Date of Birth: May 19, 1940 Admit Date: 10/12/2013 Admitting Physician Rise Patience, MD CWC:BJSEGB,TDVV A, MD  Brief Summary Audrey Carr is an 73 y.o. female history diabetes mellitus, hypertension and hyperlipidemia presenting with new onset left lower facial droop and mild left sided weakness.MRI of her brain showed moderate size acute right MCA infarction. MRA showed right M2 superior division occlusion as well as suspected moderate to severe cavernous/supraclinoid ICA stenosis.Current plans are for SNF on discharge.  Subjective: No major issues overnight  Assessment/Plan: Principal Problem: Right MCA CVA  -admitted with left lower facial droop and mild left sided weakness. MRI of her brain showed moderate size acute right MCA infarction. MRA showed right M2 superior division occlusion as well as suspected moderate to severe cavernous/supraclinoid ICA stenosis.2-D echogram shows EF of 60% with chronic grade 1 diastolic CHF. Carotid Doppler showed 1-39% bilateral ICA stenosis.Per Dr Laurette Schimke note from 10/3 TEE not necessary.AIC at 10.8,LDL 194. Currently Neurology recommending on ASA, Plavix for 3 months, and then Plavix alone. Continue with Statin on discharge. Continues to have left facial droop, but left sided weakness has improved. Will aggressive risk factor modification.  DM-2 -CBG's managed with SSI, Glipizide as inpatient. A1C 10.2 Will aggressive risk factor modification. Resume Metformin on discharge.   Dyslipidemia -LDL-194-continue with statin on discharge. Repeat Lipids in 3-6 months  HTN -resume Amlodipine on discharge.  History of smoking -Counseled to quit.  Disposition: Remain inpatient  DVT Prophylaxis: Prophylactic Lovenox   Code Status: Full code   Family Communication None at bedside  Procedures:  None  CONSULTS:  neurology  Time spent 40 minutes-which includes 50%  of the time with face-to-face with patient/ family and coordinating care related to the above assessment and plan.    MEDICATIONS: Scheduled Meds: . aspirin EC  81 mg Oral Daily  . atorvastatin  80 mg Oral q1800  . clopidogrel  75 mg Oral Daily  . enoxaparin (LOVENOX) injection  40 mg Subcutaneous Q24H  . escitalopram  10 mg Oral Daily  . glipiZIDE  10 mg Oral Q breakfast  . insulin aspart  0-9 Units Subcutaneous TID WC   Continuous Infusions:  PRN Meds:.hydrALAZINE, senna-docusate  Antibiotics: Anti-infectives   None       PHYSICAL EXAM: Vital signs in last 24 hours: Filed Vitals:   10/15/13 0000 10/15/13 0400 10/15/13 0830 10/15/13 1230  BP: 150/64 161/82 140/83 144/89  Pulse: 67 70 72 67  Temp: 98.7 F (37.1 C) 98.6 F (37 C) 98.8 F (37.1 C) 98.1 F (36.7 C)  TempSrc:   Oral Oral  Resp: 18 16 18 18   Height:      Weight:  58.06 kg (128 lb)    SpO2: 100% 100% 98% 100%    Weight change:  Filed Weights   10/12/13 2205 10/15/13 0400  Weight: 58.196 kg (128 lb 4.8 oz) 58.06 kg (128 lb)   Body mass index is 22.68 kg/(m^2).   Gen Exam: Awake and alert with mild dysarthria.   Neck: Supple, No JVD.   Chest: B/L Clear.   CVS: S1 S2 Regular, no murmurs.  Abdomen: soft, BS +, non tender, non distended.  Extremities: no edema, lower extremities warm to touch. Neurologic: Left facial droop.Left sided weakness-4/5 Skin: No Rash.   Wounds: N/A.    Intake/Output from previous day: No intake or output data in the 24 hours ending 10/15/13 1404  LAB RESULTS: CBC  Recent Labs Lab 10/12/13 1615 10/12/13 1625 10/13/13 0446  WBC 7.8  --  7.3  HGB 15.3* 16.3* 14.3  HCT 44.8 48.0* 43.3  PLT 238  --  252  MCV 92.4  --  94.1  MCH 31.5  --  31.1  MCHC 34.2  --  33.0  RDW 14.1  --  14.2  LYMPHSABS 3.0  --  3.6  MONOABS 0.3  --  0.3  EOSABS 0.1  --  0.1  BASOSABS 0.1  --  0.1    Chemistries   Recent Labs Lab 10/12/13 1615 10/12/13 1625 10/13/13 0446    NA 144 141 138  K 4.3 4.1 4.0  CL 103 105 101  CO2 25  --  25  GLUCOSE 174* 175* 138*  BUN 13 14 12   CREATININE 0.72 0.70 0.75  CALCIUM 10.0  --  9.4    CBG:  Recent Labs Lab 10/14/13 1144 10/14/13 1633 10/14/13 2203 10/15/13 0829 10/15/13 1158  GLUCAP 226* 157* 131* 161* 258*    GFR Estimated Creatinine Clearance: 51 ml/min (by C-G formula based on Cr of 0.75).  Coagulation profile  Recent Labs Lab 10/12/13 1615  INR 0.94    Cardiac Enzymes No results found for this basename: CK, CKMB, TROPONINI, MYOGLOBIN,  in the last 168 hours  No components found with this basename: POCBNP,  No results found for this basename: DDIMER,  in the last 72 hours  Recent Labs  10/13/13 0446  HGBA1C 10.8*    Recent Labs  10/13/13 0500  CHOL 281*  HDL 59  LDLCALC 194*  TRIG 141  CHOLHDL 4.8   No results found for this basename: TSH, T4TOTAL, FREET3, T3FREE, THYROIDAB,  in the last 72 hours No results found for this basename: VITAMINB12, FOLATE, FERRITIN, TIBC, IRON, RETICCTPCT,  in the last 72 hours No results found for this basename: LIPASE, AMYLASE,  in the last 72 hours  Urine Studies No results found for this basename: UACOL, UAPR, USPG, UPH, UTP, UGL, UKET, UBIL, UHGB, UNIT, UROB, ULEU, UEPI, UWBC, URBC, UBAC, CAST, CRYS, UCOM, BILUA,  in the last 72 hours  MICROBIOLOGY: No results found for this or any previous visit (from the past 240 hour(s)).  RADIOLOGY STUDIES/RESULTS: Dg Chest 2 View  10/13/2013   CLINICAL DATA:  Stroke.  EXAM: CHEST  2 VIEW  COMPARISON:  None.  FINDINGS: The heart size and mediastinal contours are within normal limits. Both lungs are clear. The visualized skeletal structures are unremarkable. Calcified and tortuous aorta.  IMPRESSION: No active cardiopulmonary disease.   Electronically Signed   By: Lucienne Capers M.D.   On: 10/13/2013 01:23   Ct Head Wo Contrast  10/12/2013   CLINICAL DATA:  New onset of facial droop ; no history of CNS  abnormality  EXAM: CT HEAD WITHOUT CONTRAST  TECHNIQUE: Contiguous axial images were obtained from the base of the skull through the vertex without intravenous contrast.  COMPARISON:  None.  FINDINGS: There is mild diffuse cerebral and cerebellar atrophy with compensatory ventriculomegaly. There is decreased density in the deep white matter of both cerebral hemispheres. This is greater on the right in the frontal region that elsewhere. There are basal ganglia calcifications bilaterally. There is no acute intracranial hemorrhage. The cerebellum and brainstem are unremarkable.  The observed paranasal sinuses and mastoid air cells are clear. The middle ear cavities are well pneumatized. The internal auditory canals are unremarkable.  IMPRESSION: 1. There is no acute intracranial  hemorrhage nor objective evidence of acute ischemic change. 2. There is decreased density in the deep white matter of both cerebral hemispheres, greatest in the right frontal region, consistent with chronic small vessel ischemic change. If the patient's clinical findings do not reflect a typical Bell's palsy, MRI of the brain now may be useful.   Electronically Signed   By: David  Martinique   On: 10/12/2013 15:35   Mr Jodene Nam Head Wo Contrast  10/12/2013   CLINICAL DATA:  Left-sided facial droop since last night.  EXAM: MRI HEAD WITHOUT CONTRAST  MRA HEAD WITHOUT CONTRAST  TECHNIQUE: Multiplanar, multiecho pulse sequences of the brain and surrounding structures were obtained without intravenous contrast. Angiographic images of the head were obtained using MRA technique without contrast.  COMPARISON:  Head CT 10/12/2013  FINDINGS: MRI HEAD FINDINGS  There is a moderate-sized acute infarct involving the right cerebral hemisphere predominantly in the white matter of the right frontal lobe and corona radiata in the MCA territory. There is no definite evidence of acute hemorrhage associated with the area of infarction. A few scattered remote  microhemorrhages are noted in the cerebral hemispheres. Foci of T2 hyperintensity in the subcortical and deep cerebral white matter and pons are nonspecific but compatible with mild chronic small vessel ischemic disease. There is mild generalized cerebral atrophy. There is no mass, midline shift, or extra-axial fluid collection.  Orbits are unremarkable. Paranasal sinuses and mastoid air cells are clear. Major intracranial vascular flow voids are preserved.  MRA HEAD FINDINGS  Images are moderately to severely degraded by motion artifact. Visualized distal vertebral arteries are patent with the right being dominant. There is mild, diffuse irregularity of the intracranial right vertebral artery with moderate stenosis distally. PICA origins are patent. Right AICA origin appears patent. Basilar artery is patent with evaluation for stenosis limited by extensive motion artifact in its proximal and mid portions. P1 segments are patent without evidence of stenosis. Moderate bilateral PCA branch vessel irregular narrowing is present. Posterior communicating arteries are not clearly identified.  Proximal intracranial internal carotid arteries are patent without evidence of stenosis. There is severe motion artifact through the cavernous and supraclinoid carotid, limiting evaluation. At least moderate if not severe right cavernous and proximal supraclinoid ICA stenosis is suspected, however motion limits characterization. Mild cavernous carotid stenosis is also suspected on the left.  Right M1 segment is patent with at most mild narrowing in its midportion. M2 divisions are patent at their origins, however there is occlusion of the superior M2 division approximately 5 mm beyond its origin. Left M1 segment is patent without stenosis. Moderate left MCA branch vessel irregularity is present. A1 segments are patent with mild right greater than left vessel irregularity but no high-grade stenosis. Mild-to-moderate ACA branch vessel  irregularity is present. No gross intracranial aneurysm is identified.  IMPRESSION: 1. Moderate-sized, acute right MCA infarct. 2. Mild chronic small vessel ischemic disease and cerebral atrophy. Scattered, VIII cerebral micro hemorrhages. 3. Moderately to severely motion degraded head MRA. Right M2 superior division occlusion just beyond its origin. 4. 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   Electronically Signed   By: Logan Bores   On: 10/12/2013 19:55   Mr Brain Wo Contrast  10/12/2013   CLINICAL DATA:  Left-sided facial droop since last night.  EXAM: MRI HEAD WITHOUT CONTRAST  MRA HEAD WITHOUT CONTRAST  TECHNIQUE: Multiplanar, multiecho pulse sequences of the brain and surrounding structures were obtained without intravenous contrast. Angiographic images of the head were  obtained using MRA technique without contrast.  COMPARISON:  Head CT 10/12/2013  FINDINGS: MRI HEAD FINDINGS  There is a moderate-sized acute infarct involving the right cerebral hemisphere predominantly in the white matter of the right frontal lobe and corona radiata in the MCA territory. There is no definite evidence of acute hemorrhage associated with the area of infarction. A few scattered remote microhemorrhages are noted in the cerebral hemispheres. Foci of T2 hyperintensity in the subcortical and deep cerebral white matter and pons are nonspecific but compatible with mild chronic small vessel ischemic disease. There is mild generalized cerebral atrophy. There is no mass, midline shift, or extra-axial fluid collection.  Orbits are unremarkable. Paranasal sinuses and mastoid air cells are clear. Major intracranial vascular flow voids are preserved.  MRA HEAD FINDINGS  Images are moderately to severely degraded by motion artifact. Visualized distal vertebral arteries are patent with the right being dominant. There is mild, diffuse irregularity of the intracranial right vertebral artery with moderate  stenosis distally. PICA origins are patent. Right AICA origin appears patent. Basilar artery is patent with evaluation for stenosis limited by extensive motion artifact in its proximal and mid portions. P1 segments are patent without evidence of stenosis. Moderate bilateral PCA branch vessel irregular narrowing is present. Posterior communicating arteries are not clearly identified.  Proximal intracranial internal carotid arteries are patent without evidence of stenosis. There is severe motion artifact through the cavernous and supraclinoid carotid, limiting evaluation. At least moderate if not severe right cavernous and proximal supraclinoid ICA stenosis is suspected, however motion limits characterization. Mild cavernous carotid stenosis is also suspected on the left.  Right M1 segment is patent with at most mild narrowing in its midportion. M2 divisions are patent at their origins, however there is occlusion of the superior M2 division approximately 5 mm beyond its origin. Left M1 segment is patent without stenosis. Moderate left MCA branch vessel irregularity is present. A1 segments are patent with mild right greater than left vessel irregularity but no high-grade stenosis. Mild-to-moderate ACA branch vessel irregularity is present. No gross intracranial aneurysm is identified.  IMPRESSION: 1. Moderate-sized, acute right MCA infarct. 2. Mild chronic small vessel ischemic disease and cerebral atrophy. Scattered, VIII cerebral micro hemorrhages. 3. Moderately to severely motion degraded head MRA. Right M2 superior division occlusion just beyond its origin. 4. 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   Electronically Signed   By: Logan Bores   On: 10/12/2013 19:55    Oren Binet, MD  [facility] Pager:[phone]  If 7PM-7AM, please contact night-coverage [email or web address] Password [email or web address] 10/15/2013, 2:04 PM   LOS: 3 days   **Disclaimer: This note may have been  dictated with voice recognition software. Similar sounding words can inadvertently be transcribed and this note may contain transcription errors which may not have been corrected upon publication of note.**

## 2013-10-15 NOTE — Discharge Summary (Addendum)
PATIENT DETAILS Name: Audrey Carr Age: 73 y.o. Sex: female Date of Birth: April 09, 1940 MRN: [id number]. Admitting Physician: Rise Patience, MD POE:UMPNTI,RWER A, MD  Admit Date: 10/12/2013 Discharge date: 10/16/2013  Recommendations for Outpatient Follow-up:  Aggressive risk factor modification to prevent further CVA in future Will need SLP/PT follow up at SNF New medications: Plavix 75 mg by mouth daily Medication change: Aspirin 81 mg discontinued New medications: Insulin sliding scale.  PRIMARY DISCHARGE DIAGNOSIS:  Principal Problem:   CVA (cerebral vascular accident) Active Problems:   Hypertension   Hyperlipidemia   Type 2 diabetes mellitus   CVA (cerebral infarction)    chronic diastolic heart failure  PAST MEDICAL HISTORY: Past Medical History  Diagnosis Date  . Diabetes mellitus without complication   . Stroke 10/12/2013    DISCHARGE MEDICATIONS:   Medication List         amLODipine 5 MG tablet  Commonly known as:  NORVASC  Take 5 mg by mouth daily.     atorvastatin 40 MG tablet  Commonly known as:  LIPITOR  Take 2 tablets (80 mg total) by mouth daily.     clopidogrel 75 MG tablet  Commonly known as:  PLAVIX  Take 1 tablet (75 mg total) by mouth daily.     escitalopram 10 MG tablet  Commonly known as:  LEXAPRO  Take 10 mg by mouth daily.     glipiZIDE 10 MG 24 hr tablet  Commonly known as:  GLUCOTROL XL  Take 10 mg by mouth daily with breakfast.     insulin aspart 100 UNIT/ML injection  Commonly known as:  novoLOG  - 0-9 Units, Subcutaneous, 3 times daily with meals  - CBG < 70: implement hypoglycemia protocol  - CBG 70 - 120: 0 units  - CBG 121 - 150: 1 unit  - CBG 151 - 200: 2 units  - CBG 201 - 250: 3 units  - CBG 251 - 300: 5 units  - CBG 301 - 350: 7 units  - CBG 351 - 400: 9 units  - CBG > 400: call MD     metFORMIN 500 MG tablet  Commonly known as:  GLUCOPHAGE  Take 500 mg by mouth 2 (two) times daily with a  meal.        ALLERGIES:  No Known Allergies  BRIEF HPI:  See H&P, Labs, Consult and Test reports for all details in brief, Audrey Carr is an 73 y.o. female history diabetes mellitus, hypertension and hyperlipidemia presenting with new onset left lower facial droop and mild left sided weakness.MRI of her brain showed moderate size acute right MCA infarction.  CONSULTATIONS:   neurology  PERTINENT RADIOLOGIC STUDIES: Dg Chest 2 View  10/13/2013   IMPRESSION: No active cardiopulmonary disease.   Electronically Signed   By: Lucienne Capers M.D.   On: 10/13/2013 01:23   Ct Head Wo Contrast  10/12/2013    IMPRESSION: 1. There is no acute intracranial hemorrhage nor objective evidence of acute ischemic change. 2. There is decreased density in the deep white matter of both cerebral hemispheres, greatest in the right frontal region, consistent with chronic small vessel ischemic change. If the patient's clinical findings do not reflect a typical Bell's palsy, MRI of the brain now may be useful.   Electronically Signed   By: David  Martinique   On: 10/12/2013 15:35   Mr [facility] Wo Contrast Mr Brain Wo Contrast  10/12/2013   .  IMPRESSION: 1. Moderate-sized,  acute right MCA infarct. 2. Mild chronic small vessel ischemic disease and cerebral atrophy. Scattered, VIII cerebral micro hemorrhages. 3. Moderately to severely motion degraded head MRA. Right M2 superior division occlusion just beyond its origin. 4. 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   Electronically Signed   By: Logan Bores   On: 10/12/2013 19:55     PERTINENT LAB RESULTS: CBC:  Recent Labs  10/12/13 1615 10/12/13 1625 10/13/13 0446  WBC 7.8  --  7.3  HGB 15.3* 16.3* 14.3  HCT 44.8 48.0* 43.3  PLT 238  --  252   CMET CMP     Component Value Date/Time   NA 138 10/13/2013 0446   K 4.0 10/13/2013 0446   CL 101 10/13/2013 0446   CO2 25 10/13/2013 0446   GLUCOSE 138* 10/13/2013 0446   BUN 12  10/13/2013 0446   CREATININE 0.75 10/13/2013 0446   CALCIUM 9.4 10/13/2013 0446   PROT 6.9 10/13/2013 0446   ALBUMIN 3.4* 10/13/2013 0446   AST 13 10/13/2013 0446   ALT 7 10/13/2013 0446   ALKPHOS 115 10/13/2013 0446   BILITOT 0.3 10/13/2013 0446   GFRNONAA 81* 10/13/2013 0446   GFRAA >90 10/13/2013 0446    GFR Estimated Creatinine Clearance: 51 ml/min (by C-G formula based on Cr of 0.75). No results found for this basename: LIPASE, AMYLASE,  in the last 72 hours No results found for this basename: CKTOTAL, CKMB, CKMBINDEX, TROPONINI,  in the last 72 hours No components found with this basename: POCBNP,  No results found for this basename: DDIMER,  in the last 72 hours  Recent Labs  10/13/13 0446  HGBA1C 10.8*    Recent Labs  10/13/13 0500  CHOL 281*  HDL 59  LDLCALC 194*  TRIG 141  CHOLHDL 4.8   No results found for this basename: TSH, T4TOTAL, FREET3, T3FREE, THYROIDAB,  in the last 72 hours No results found for this basename: VITAMINB12, FOLATE, FERRITIN, TIBC, IRON, RETICCTPCT,  in the last 72 hours Coags:  Recent Labs  10/12/13 1615  INR 0.94   Microbiology: No results found for this or any previous visit (from the past 240 hour(s)).   BRIEF HOSPITAL COURSE:  Right MCA CVA  -admitted with left lower facial droop and mild left sided weakness. MRI of her brain showed moderate size acute right MCA infarction. MRA showed right M2 superior division occlusion as well as suspected moderate to severe cavernous/supraclinoid ICA stenosis.2-D echogram shows EF of 60% with chronic grade 1 diastolic CHF. Carotid Doppler showed 1-39% bilateral ICA stenosis.Per Dr Laurette Schimke note from 10/3 TEE not necessary.AIC at 10.8,LDL 194. Currently Neurology recommending on ASA, Plavix for 3 months, and then Plavix alone. Continue with Statin on discharge. Continues to have left facial droop, but left sided weakness has improved. Will aggressive risk factor modification.   DM-2  -CBG's managed with  SSI, Glipizide as inpatient. A1C 10.2 Will aggressive risk factor modification. Resume Metformin on discharge.   Dyslipidemia  -LDL-194-continue with statin on discharge. Repeat Lipids in 3-6 months   HTN  -resume Amlodipine on discharge.   Chronic diastolic heart failure BNP ordered on day of discharge and found to be normal at 218  History of smoking  -Counseled to quit.  TODAY-DAY OF DISCHARGE:  Subjective:   Audrey Carr today has no headache,no chest abdominal pain,no new weakness tingling or numbness, feels much better wants to go home today.   Objective:   Blood pressure 144/89, pulse 67, temperature  98.1 F (36.7 C), temperature source Oral, resp. rate 18, height 5\' 3"  (1.6 m), weight 58.06 kg (128 lb), SpO2 100.00%. No intake or output data in the 24 hours ending 10/15/13 1525 Filed Weights   10/12/13 2205 10/15/13 0400  Weight: 58.196 kg (128 lb 4.8 oz) 58.06 kg (128 lb)    Exam Awake Alert, Oriented *3, Normal affect,left facial droop [place].AT,PERRAL Supple Neck,No JVD, No cervical lymphadenopathy appriciated.  Symmetrical Chest wall movement, Good air movement bilaterally, CTAB RRR,No Gallops,Rubs or new Murmurs, No Parasternal Heave +ve B.Sounds, Abd Soft, Non tender, No organomegaly appriciated, No rebound -guarding or rigidity. No Cyanosis, Clubbing or edema, No new Rash or bruise  DISCHARGE CONDITION: Stable  DISPOSITION: SNF  DISCHARGE INSTRUCTIONS:    Activity:  As tolerated with Full fall precautions use walker/cane & assistance as needed  Diet recommendation: Diabetic Diet Heart Healthy diet       Discharge Instructions   Call MD for:  persistant dizziness or light-headedness    Complete by:  As directed      Diet - low sodium heart healthy    Complete by:  As directed      Diet Carb Modified    Complete by:  As directed      Increase activity slowly    Complete by:  As directed            Follow-up Information   Follow up with  Cristopher Peru, MD. (Outpatient loop recorder and TEE)    Specialty:  Cardiology   Contact information:   [address] [place] [address] [phone]       Follow up with Jerlyn Ly, MD. Schedule an appointment as soon as possible for a visit in 1 week.   Specialty:  Internal Medicine   Contact information:   [place] [address] [phone]       Follow up with SETHI,PRAMOD, MD. Schedule an appointment as soon as possible for a visit in 2 months.   Specialties:  Neurology, Radiology   Contact information:   [address] [place] [address] [phone]       Total Time spent on discharge equals 45 minutes.  SignedOren Binet 10/15/2013 3:25 PM

## 2013-10-15 NOTE — Progress Notes (Signed)
STROKE TEAM PROGRESS NOTE   HISTORY Audrey Carr is an 73 y.o. female history diabetes mellitus, hypertension and hyperlipidemia presenting with new onset left lower facial droop which was first noticed about 8 PM last night 10/11/2013. Patient has also developed slurring of speech. She's had no difficulty with swallowing. She said no left upper normal left lower extremity weakness no numbness. Has no previous history of stroke or TIA. She has not been on antiplatelet therapy. MRI of her brain showed moderate size acute right MCA infarction. 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 NIH stroke score was 3. Patient was not administered TPA secondary to delay in arrival. She was admitted for further evaluation and treatment.   SUBJECTIVE (INTERVAL HISTORY) No family is at the bedside.  Overall she feels her condition is stable. But still has significant left facial droop and slurry speech, no extremity weakness. No changes noted.Await SNF Dc when bed available   OBJECTIVE Temp:  [98.1 F (36.7 C)-98.8 F (37.1 C)] 98.1 F (36.7 C) (10/04 1230) Pulse Rate:  [67-80] 67 (10/04 1230) Cardiac Rhythm:  [-] Normal sinus rhythm (10/04 0805) Resp:  [16-18] 18 (10/04 1230) BP: (140-161)/(64-89) 144/89 mmHg (10/04 1230) SpO2:  [98 %-100 %] 100 % (10/04 1230) Weight:  [128 lb (58.06 kg)] 128 lb (58.06 kg) (10/04 0400)   Recent Labs Lab 10/14/13 1633 10/14/13 2203 10/15/13 0829 10/15/13 1158 10/15/13 1650  GLUCAP 157* 131* 161* 258* 101*    Recent Labs Lab 10/12/13 1615 10/12/13 1625 10/13/13 0446  NA 144 141 138  K 4.3 4.1 4.0  CL 103 105 101  CO2 25  --  25  GLUCOSE 174* 175* 138*  BUN 13 14 12   CREATININE 0.72 0.70 0.75  CALCIUM 10.0  --  9.4    Recent Labs Lab 10/12/13 1615 10/13/13 0446  AST 15 13  ALT 9 7  ALKPHOS 131* 115  BILITOT 0.3 0.3  PROT 8.1 6.9  ALBUMIN 4.1 3.4*    Recent  Labs Lab 10/12/13 1615 10/12/13 1625 10/13/13 0446  WBC 7.8  --  7.3  NEUTROABS 4.4  --  3.3  HGB 15.3* 16.3* 14.3  HCT 44.8 48.0* 43.3  MCV 92.4  --  94.1  PLT 238  --  252   No results found for this basename: CKTOTAL, CKMB, CKMBINDEX, TROPONINI,  in the last 168 hours No results found for this basename: LABPROT, INR,  in the last 72 hours No results found for this basename: COLORURINE, APPERANCEUR, LABSPEC, PHURINE, GLUCOSEU, HGBUR, BILIRUBINUR, KETONESUR, PROTEINUR, UROBILINOGEN, NITRITE, LEUKOCYTESUR,  in the last 72 hours     Component Value Date/Time   CHOL 281* 10/13/2013 0500   TRIG 141 10/13/2013 0500   HDL 59 10/13/2013 0500   CHOLHDL 4.8 10/13/2013 0500   VLDL 28 10/13/2013 0500   LDLCALC 194* 10/13/2013 0500   Lab Results  Component Value Date   HGBA1C 10.8* 10/13/2013      Component Value Date/Time   LABOPIA NONE DETECTED 10/12/2013 1743   COCAINSCRNUR NONE DETECTED 10/12/2013 1743   LABBENZ NONE DETECTED 10/12/2013 1743   AMPHETMU NONE DETECTED 10/12/2013 [place] 10/12/2013 [place] DETECTED 10/12/2013 1743     Recent Labs Lab 10/12/13 1615  ETH <11    Dg Chest 2 View 10/13/2013   No active cardiopulmonary disease.     Ct Head Wo Contrast 10/12/2013   1.  There is no acute intracranial hemorrhage nor objective evidence of acute ischemic change. 2. There is decreased density in the deep white matter of both cerebral hemispheres, greatest in the right frontal region, consistent with chronic small vessel ischemic change.   Mri & Mra Brain Wo Contrast 10/12/2013    1. Moderate-sized, acute right MCA infarct. 2. Mild chronic small vessel ischemic disease and cerebral atrophy. Scattered, VIII cerebral micro hemorrhages. 3. Moderately to severely motion degraded head MRA. Right M2 superior division occlusion just beyond its origin. 4. 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     2D echo - Left ventricle: The  cavity size was normal. Wall thickness was normal. Systolic function was normal. The estimated ejection fraction was in the range of 60% to 65%. Doppler parameters are consistent with abnormal left ventricular relaxation (grade 1 diastolic dysfunction).  CUS -1-39% bilateral ICA stenosis PHYSICAL EXAM  Temp:  [98.1 F (36.7 C)-98.8 F (37.1 C)] 98.1 F (36.7 C) (10/04 1230) Pulse Rate:  [67-80] 67 (10/04 1230) Resp:  [16-18] 18 (10/04 1230) BP: (140-161)/(64-89) 144/89 mmHg (10/04 1230) SpO2:  [98 %-100 %] 100 % (10/04 1230) Weight:  [128 lb (58.06 kg)] 128 lb (58.06 kg) (10/04 0400)  General - Well nourished, well developed, in no apparent distress.  Ophthalmologic - not able to see through.  Cardiovascular - Regular rate and rhythm with no murmur.  Mental Status -  Level of arousal and orientation to time, place, and person were intact. Language including expression, naming, repetition, comprehension was assessed and found intact, mild dysarthria.  Cranial Nerves II - XII - II - Visual field intact OU. III, IV, VI - Extraocular movements intact. V - Facial sensation intact bilaterally. VII - Facial movement intact bilaterally. VIII - Hearing & vestibular intact bilaterally. X - Palate elevates symmetrically, but mild dysarthria. XI - Chin turning & shoulder shrug intact bilaterally. XII - Tongue protrusion intact.  Motor Strength - The patient's strength was normal in all extremities and pronator drift was absent.  Bulk was normal and fasciculations were absent.   Motor Tone - Muscle tone was assessed at the neck and appendages and was normal.  Reflexes - The patient's reflexes were normal in all extremities and she had no pathological reflexes.  Sensory - Light touch, temperature/pinprick were assessed and were normal.    Coordination - The patient had normal movements in the hands and feet with no ataxia or dysmetria.  Tremor was absent.  Gait and Station - not  tested   ASSESSMENT/PLAN  [name] is a 73 y.o. female with history of diabetes mellitus, hypertension and hyperlipidemia presenting with new onset left lower facial droop. She did not receive IV t-PA due to delay in arrival. MRI imaging confirms a right MCA infarct.    Stroke:  right MCA infarct, etiology could be large vessel atherosclerosis due to right cavernous ICA stenosis  MRI  R MCA infarct  MRA  small vessel disease, microhemorrhages, R M2 occlusion, mod to severe R ICA disease  Carotid Doppler  1-39 % ICA stenosis b/l  2D Echo  unremarkable  TEE not necessary   no antithrombotics prior to admission, now on aspirin 325 mg orally every day  Lovenox 40 mg sq daily for VTE prophylaxis  Carb Control thin liquids.   OOB with assistance  Therapy recommendations:  SNF  Ongoing aggressive risk factor management  Risk factor education  Disposition:  skilled nursing facility   Hypertension  Permissive hypertension <220/120 for 24-48 hours and then gradually normalize within 5-7 days  BP goal long term normotensive BP 131-197/58-108 past 24h (10/15/2013 @ 6:39 PM)  Stable  Hyperlipidemia  Home meds:  lipitor 40, increased to 80 in hospital.  LDL 194, not on the goal < 70  Need aggressive control.  Continue statin at discharge  Diabetes  HgbA1c 10.8, Goal < 7.0  Uncontrolled  Tobacco abuse - smoking cessation counseling provided - pt is going to try to quit  Other Stroke Risk Factors Advanced age Cigarette smoker, advised to stop smoking   Family hx stroke (mother)  Hospital day # 3 Recommend aspirin plus plavix x 3 months then Plavix alone and aggressive risk factor modification.Stroke team will sign off. Call for questions.    I, the attending vascular neurologist, have personally obtained a history, examined the patient, evaluated laboratory data, individually viewed imaging studies, and formulated the assessment and plan of care.  I  have made any additions or clarifications directly to the above note and agree with the findings and plan as currently documented.   Antony Contras, MD Stroke Neurology 10/15/2013 6:39 PM   To contact Stroke Continuity provider, please refer to [email or web address] After hours, contact General Neurology

## 2013-10-15 NOTE — Clinical Social Work Placement (Addendum)
Clinical Social Work Department CLINICAL SOCIAL WORK PLACEMENT NOTE 10/15/2013  Patient:  Audrey Carr, Audrey Carr  Account Number:  [id number] Admit date:  10/12/2013  Clinical Social Worker:  Carrington Clamp, LCSWA  Date/time:  10/15/2013 09:00 PM  Clinical Social Work is seeking post-discharge placement for this patient at the following level of care:   [place]   (*CSW will update this form in Epic as items are completed)   10/15/2013  Patient/family provided with [place] Department of Clinical Social Work's list of facilities offering this level of care within the geographic area requested by the patient (or if unable, by the patient's family).  10/15/2013  Patient/family informed of their freedom to choose among providers that offer the needed level of care, that participate in Medicare, Medicaid or managed care program needed by the patient, have an available bed and are willing to accept the patient.  10/15/2013  Patient/family informed of [facility]' ownership interest in [facility], as well as of the fact that they are under no obligation to receive care at this facility.  PASARR submitted to EDS on 10/15/2013 PASARR number received on 10/15/2013  FL2 transmitted to all facilities in geographic area requested by pt/family on  10/15/2013 FL2 transmitted to all facilities within larger geographic area on   Patient informed that his/her managed care company has contracts with or will negotiate with  certain facilities, including the following:     Patient/family informed of bed offers received:  10/16/2913 Patient chooses bed at [facility] Physician recommends and patient chooses bed at    [facility]  10/16/2913 Patient to be transferred to facility by [facility] Patient and family notified of transfer on 10/16/2913 Name of family member notified:  Pt declined that there was anyone CSW could contact  The following  physician request were entered in Epic:   Additional Comments:  Chiropodist, [place] Weekend Clinical Social Worker [phone]

## 2013-10-15 NOTE — Clinical Social Work Psychosocial (Addendum)
Clinical Social Work Department BRIEF PSYCHOSOCIAL ASSESSMENT 10/15/2013  Patient:  Audrey Carr, Audrey Carr     Account Number:  [id number]     Admit date:  10/12/2013  Clinical Social Worker:  Hubert Azure  Date/Time:  10/15/2013 08:25 PM  Referred by:  Physician  Date Referred:  10/15/2013 Referred for  SNF Placement   Other Referral:   Interview type:  Patient Other interview type:    PSYCHOSOCIAL DATA Living Status:  ALONE Admitted from facility:   Level of care:   Primary support name:  None listed Primary support relationship to patient:   Degree of support available:   Poor, patient unable to provide support at the present time.    CURRENT CONCERNS Current Concerns  Post-Acute Placement   Other Concerns:    SOCIAL WORK ASSESSMENT / PLAN CSW met with patient who was alert and oriented. CSW introduced self and explained role. CSW explained SNF placement process and discussed d/c plan with patient. Per patient, she resides at home. Patient states husband has been at [facility] since April 2014 due to 2 falls at home. Patient denied using any assistive devices at home. Per patient, she is agreeable to SNF placement and has no preference for SNF and just "wants to be able to function for as much as I can for as long as I can."  Patient, however states, she desires to remain in [facility].   Assessment/plan status:  Other - See comment Other assessment/ plan:   CSW to complete FL2 and submit PASARR for SNF placement.   Information/referral to community resources:    PATIENT'S/FAMILY'S RESPONSE TO PLAN OF CARE: Patient cooperative and indifferent toward SNF placement. Patient is agreeable to placement as she states it will benefit her health.   [place], [place] Weekend Clinical Social Worker [phone]

## 2013-10-16 ENCOUNTER — Encounter (HOSPITAL_COMMUNITY)
Admission: EM | Disposition: A | Discharge status: Skilled Nursing Facility | Payer: Self-pay | Source: Home / Self Care | Attending: Internal Medicine

## 2013-10-16 DIAGNOSIS — E785 Hyperlipidemia, unspecified: Secondary | ICD-10-CM | POA: Diagnosis not present

## 2013-10-16 DIAGNOSIS — F325 Major depressive disorder, single episode, in full remission: Secondary | ICD-10-CM | POA: Diagnosis not present

## 2013-10-16 DIAGNOSIS — C785 Secondary malignant neoplasm of large intestine and rectum: Secondary | ICD-10-CM | POA: Diagnosis not present

## 2013-10-16 DIAGNOSIS — R4181 Age-related cognitive decline: Secondary | ICD-10-CM | POA: Diagnosis not present

## 2013-10-16 DIAGNOSIS — E119 Type 2 diabetes mellitus without complications: Secondary | ICD-10-CM | POA: Diagnosis not present

## 2013-10-16 DIAGNOSIS — I6930 Unspecified sequelae of cerebral infarction: Secondary | ICD-10-CM | POA: Diagnosis not present

## 2013-10-16 DIAGNOSIS — D649 Anemia, unspecified: Secondary | ICD-10-CM | POA: Diagnosis not present

## 2013-10-16 DIAGNOSIS — E039 Hypothyroidism, unspecified: Secondary | ICD-10-CM | POA: Diagnosis not present

## 2013-10-16 DIAGNOSIS — I5032 Chronic diastolic (congestive) heart failure: Secondary | ICD-10-CM

## 2013-10-16 DIAGNOSIS — I6350 Cerebral infarction due to unspecified occlusion or stenosis of unspecified cerebral artery: Secondary | ICD-10-CM | POA: Diagnosis not present

## 2013-10-16 DIAGNOSIS — E1149 Type 2 diabetes mellitus with other diabetic neurological complication: Secondary | ICD-10-CM | POA: Diagnosis not present

## 2013-10-16 DIAGNOSIS — R531 Weakness: Secondary | ICD-10-CM | POA: Diagnosis not present

## 2013-10-16 DIAGNOSIS — R41841 Cognitive communication deficit: Secondary | ICD-10-CM | POA: Diagnosis not present

## 2013-10-16 DIAGNOSIS — I6990 Unspecified sequelae of unspecified cerebrovascular disease: Secondary | ICD-10-CM | POA: Diagnosis not present

## 2013-10-16 DIAGNOSIS — R2689 Other abnormalities of gait and mobility: Secondary | ICD-10-CM | POA: Diagnosis not present

## 2013-10-16 DIAGNOSIS — I10 Essential (primary) hypertension: Secondary | ICD-10-CM | POA: Diagnosis not present

## 2013-10-16 DIAGNOSIS — I639 Cerebral infarction, unspecified: Secondary | ICD-10-CM | POA: Diagnosis not present

## 2013-10-16 DIAGNOSIS — R278 Other lack of coordination: Secondary | ICD-10-CM | POA: Diagnosis not present

## 2013-10-16 DIAGNOSIS — M6281 Muscle weakness (generalized): Secondary | ICD-10-CM | POA: Diagnosis not present

## 2013-10-16 LAB — GLUCOSE, CAPILLARY
GLUCOSE-CAPILLARY: 158 mg/dL — AB (ref 70–99)
Glucose-Capillary: 131 mg/dL — ABNORMAL HIGH (ref 70–99)

## 2013-10-16 LAB — PRO B NATRIURETIC PEPTIDE: PRO B NATRI PEPTIDE: 218 pg/mL — AB (ref 0–125)

## 2013-10-16 SURGERY — ECHOCARDIOGRAM, TRANSESOPHAGEAL
Anesthesia: Moderate Sedation

## 2013-10-16 NOTE — Progress Notes (Signed)
CSW (Clinical Education officer, museum) spoke with pt about PT recommendation and explained in detail medical team staff concern for patient safety at independent living. Pt informed CSW she has been to SNF before and does not believe it is helpful for her. Pt expressed concern that pt's are sent to SNF as short term rehab but then they are made to stay there and she does not want to be long term. CSW asked if pt was concerned at all about mobility. Pt informed CSW that she has not had a fall since she got her "jazzy chair" and feels confident that with the jazzy she will be able to continue to live independently. Pt was agreeable to being set up with therapy in home. CSW called facility and notified. They will set up pt with home health therapies as long as this is listed on pt dc summary.  [place], [place]

## 2013-10-16 NOTE — Progress Notes (Signed)
Report given to nurse at [facility].  Will d/c pt at 1300 today.

## 2013-10-16 NOTE — Progress Notes (Signed)
Physical Therapy Treatment Patient Details Name: Audrey Carr MRN: [id number] DOB: 04/05/1940 Today's Date: 10/16/2013    History of Present Illness Audrey Carr is a 73 y.o. female with history of hypertension, diabetes mellitus and ongoing tobacco abuse was brought to the ER after patient was noticed to have persistent left facial droop. Patient was initially noted to have a facial droop 9/30 by patient's neighbor on 8 PM. Since patient's symptoms were persistent patient was brought to the ER. In the ER patient had CT head followed by MRI brain which showed acute right MCA stroke    PT Comments    Pt progressing but continues to demonstrate cognitive and balance deficits with need for supervision with all activity for safety and to prevent fall risk. Pt stating" I wouldn't get hurt if I fell" "I would break the fall" but lacking understanding of fall not always being preventable or controlled. Will continue to follow to maximize function and independence.   Follow Up Recommendations  SNF;Supervision/Assistance - 24 hour     Equipment Recommendations       Recommendations for Other Services       Precautions / Restrictions Precautions Precautions: Fall    Mobility  Bed Mobility               General bed mobility comments: Pt EOB on arrival  Transfers Overall transfer level: Needs assistance   Transfers: Sit to/from Stand Sit to Stand: Supervision         General transfer comment: supervision for safety from bed and toilet  Ambulation/Gait Ambulation/Gait assistance: Supervision Ambulation Distance (Feet): 200 Feet Assistive device: None Gait Pattern/deviations: Step-through pattern;Decreased stride length Gait velocity: 20'/15sec= 1.33 Gait velocity interpretation: <1.8 ft/sec, indicative of risk for recurrent falls General Gait Details: supervision for directional cues, slow cautious gait with increased fall risk   Stairs            Wheelchair  Mobility    Modified Rankin (Stroke Patients Only) Modified Rankin (Stroke Patients Only) Pre-Morbid Rankin Score: No symptoms Modified Rankin: Moderate disability     Balance Overall balance assessment: Needs assistance   Sitting balance-Leahy Scale: Good       Standing balance-Leahy Scale: Good                      Cognition Arousal/Alertness: Awake/alert Behavior During Therapy: Flat affect Overall Cognitive Status: Impaired/Different from baseline Area of Impairment: Orientation;Memory;Safety/judgement;Problem solving Orientation Level: Time   Memory: Decreased short-term memory   Safety/Judgement: Decreased awareness of safety     General Comments: Pt leaving the faucet running after brushing her teeth and required cues to turn it off. Pt very slow to process ADLs of pericare and tooth brushing. Pt changing pad even though stating it was dry and didn't need to change. Unable to recall room number after cueing but was able to count backward from 20.    Exercises      General Comments        Pertinent Vitals/Pain Pain Assessment: No/denies pain    Home Living                      Prior Function            PT Goals (current goals can now be found in the care plan section) Progress towards PT goals: Progressing toward goals    Frequency       PT Plan Current plan remains appropriate  Co-evaluation             End of Session   Activity Tolerance: Patient tolerated treatment well Patient left: in chair;with call bell/phone within reach;with chair alarm set     Time: 7048-8891 PT Time Calculation (min): 35 min  Charges:  $Therapeutic Activity: 8-22 mins $Physical Performance Test: 8-22 mins                    G Codes:      Melford Aase 10-23-13, 10:11 AM Elwyn Reach, [place]

## 2013-10-16 NOTE — Progress Notes (Signed)
Medicare Important Message given? YES  (If response is "NO", the following Medicare IM given date fields will be blank)  Date Medicare IM given: 10/16/13 Medicare IM given by:  Marvetta Gibbons RN

## 2013-10-16 NOTE — Progress Notes (Signed)
Medicare Important Message given?  YES (If response is "NO", the following Medicare IM given date fields will be blank) Date Medicare IM given:  10/16/2013 Medicare IM given by:  Azariyah Luhrs  

## 2013-10-16 NOTE — Progress Notes (Signed)
CSW (Clinical Education officer, museum) prepared pt dc packet and placed with shadow chart. CSW arranged non-emergent ambulance transport for 1pm as requested by facility. Pt, pt nurse, and facility informed. Pt denied for CSW to call family/friends but pt neighbor did call unit inquiring about pt status. Pt agreeable to updating her neighbor, so CSW provided update on dc to facility today. CSW signing off.  [place], [place]

## 2013-10-16 NOTE — Discharge Instructions (Signed)
STROKE/TIA DISCHARGE INSTRUCTIONS SMOKING Cigarette smoking nearly doubles your risk of having a stroke & is the single most alterable risk factor  If you smoke or have smoked in the last 12 months, you are advised to quit smoking for your health.  Most of the excess cardiovascular risk related to smoking disappears within a year of stopping.  Ask you doctor about anti-smoking medications  [place] Quit Line: 1-800-QUIT NOW  Free Smoking Cessation Classes [phone]  CHOLESTEROL Know your levels; limit fat & cholesterol in your diet  Lipid Panel     Component Value Date/Time   CHOL 281* 10/13/2013 0500   TRIG 141 10/13/2013 0500   HDL 59 10/13/2013 0500   CHOLHDL 4.8 10/13/2013 0500   VLDL 28 10/13/2013 0500   LDLCALC 194* 10/13/2013 0500      Many patients benefit from treatment even if their cholesterol is at goal.  Goal: Total Cholesterol (CHOL) less than 160  Goal:  Triglycerides (TRIG) less than 150  Goal:  HDL greater than 40  Goal:  LDL (LDLCALC) less than 100   BLOOD PRESSURE American Stroke Association blood pressure target is less that 120/80 mm/Hg  Your discharge blood pressure is:  BP: 140/77 mmHg  Monitor your blood pressure  Limit your salt and alcohol intake  Many individuals will require more than one medication for high blood pressure  DIABETES (A1c is a blood sugar average for last 3 months) Goal HGBA1c is under 7% (HBGA1c is blood sugar average for last 3 months)  Diabetes: Diagnosis of diabetes:  Your A1c:10.8 %    Lab Results  Component Value Date   HGBA1C 10.8* 10/13/2013     Your HGBA1c can be lowered with medications, healthy diet, and exercise.  Check your blood sugar as directed by your physician  Call your physician if you experience unexplained or low blood sugars.  PHYSICAL ACTIVITY/REHABILITATION Goal is 30 minutes at least 4 days per week  Activity: Increase activity slowly, Therapies:  Return to work:   Activity decreases your risk of  heart attack and stroke and makes your heart stronger.  It helps control your weight and blood pressure; helps you relax and can improve your mood.  Participate in a regular exercise program.  Talk with your doctor about the best form of exercise for you (dancing, walking, swimming, cycling).  DIET/WEIGHT Goal is to maintain a healthy weight  Your discharge diet is: Carb Control  liquids Your height is:  Height: 5\' 3"  (160 cm) Your current weight is: Weight: 58.832 kg (129 lb 11.2 oz) Your Body Mass Index (BMI) is:  BMI (Calculated): 22.8  Following the type of diet specifically designed for you will help prevent another stroke.  Your goal weight range is:    Your goal Body Mass Index (BMI) is 19-24.  Healthy food habits can help reduce 3 risk factors for stroke:  High cholesterol, hypertension, and excess weight.  RESOURCES Stroke/Support Group:  Call [phone]   STROKE EDUCATION PROVIDED/REVIEWED AND GIVEN TO PATIENT Stroke warning signs and symptoms How to activate emergency medical system (call 911). Medications prescribed at discharge. Need for follow-up after discharge. Personal risk factors for stroke. Pneumonia vaccine given: No Flu vaccine given: Yes, Date 10/14/2013 My questions have been answered, the writing is legible, and I understand these instructions.  I will adhere to these goals & educational materials that have been provided to me after my discharge from the hospital.

## 2013-10-16 NOTE — Progress Notes (Signed)
CSW Armed forces technical officer) spoke with pt and provided bed offers. Pt would like to accept bed offer at [facility] to be closer to her husband. CSW notified facility. Per pt, she does not have any family/friends that can help with paperwork. Facility notified that pt will need to do her own paperwork. Pt will need to dc as early as possibly on the day of dc to ensure this paperwork can be completed with facility admissions office.  [place], [place]

## 2013-10-19 DIAGNOSIS — F325 Major depressive disorder, single episode, in full remission: Secondary | ICD-10-CM | POA: Diagnosis not present

## 2013-10-19 DIAGNOSIS — M6281 Muscle weakness (generalized): Secondary | ICD-10-CM | POA: Diagnosis not present

## 2013-10-19 DIAGNOSIS — I10 Essential (primary) hypertension: Secondary | ICD-10-CM | POA: Diagnosis not present

## 2013-10-19 DIAGNOSIS — I6990 Unspecified sequelae of unspecified cerebrovascular disease: Secondary | ICD-10-CM | POA: Diagnosis not present

## 2013-11-16 DIAGNOSIS — I6350 Cerebral infarction due to unspecified occlusion or stenosis of unspecified cerebral artery: Secondary | ICD-10-CM | POA: Diagnosis not present

## 2013-11-16 DIAGNOSIS — E119 Type 2 diabetes mellitus without complications: Secondary | ICD-10-CM | POA: Diagnosis not present

## 2013-11-16 DIAGNOSIS — E785 Hyperlipidemia, unspecified: Secondary | ICD-10-CM | POA: Diagnosis not present

## 2013-11-16 DIAGNOSIS — I10 Essential (primary) hypertension: Secondary | ICD-10-CM | POA: Diagnosis not present

## 2013-11-22 ENCOUNTER — Encounter: Payer: Self-pay | Admitting: Nurse Practitioner

## 2013-11-22 ENCOUNTER — Ambulatory Visit (INDEPENDENT_AMBULATORY_CARE_PROVIDER_SITE_OTHER): Payer: Medicare Other | Admitting: Nurse Practitioner

## 2013-11-22 DIAGNOSIS — I639 Cerebral infarction, unspecified: Secondary | ICD-10-CM | POA: Diagnosis not present

## 2013-11-22 DIAGNOSIS — I63311 Cerebral infarction due to thrombosis of right middle cerebral artery: Secondary | ICD-10-CM | POA: Diagnosis not present

## 2013-11-22 MED ORDER — DONEPEZIL HCL 10 MG PO TABS: 10.0000 mg | ORAL_TABLET | Freq: Every day | ORAL | Status: DC

## 2013-11-22 MED ORDER — DONEPEZIL HCL 5 MG PO TABS: 5.0000 mg | ORAL_TABLET | Freq: Every day | ORAL | Status: DC

## 2013-11-22 MED ORDER — ESCITALOPRAM OXALATE 10 MG PO TABS: 20.0000 mg | ORAL_TABLET | Freq: Every day | ORAL | Status: DC

## 2013-11-22 NOTE — Patient Instructions (Addendum)
Continue clopidogrel 75 mg orally every day  for secondary stroke prevention and maintain strict control of hypertension with blood pressure goal below 130/90, diabetes with hemoglobin A1c goal below 6.5% and lipids with LDL cholesterol goal below 100 mg/dL.   Recommend increasing Lexapro to 20 mg daily for depression. Start Aricept 5 mg daily and increase to 10 mg after 1 month.  Followup in the future with Dr.Xu in 1-2 months, sooner as needed.   Ischemic Stroke A stroke (cerebrovascular accident) is the sudden death of brain tissue. It is a medical emergency. A stroke can cause permanent loss of brain function. This can cause problems with different parts of your body. A transient ischemic attack (TIA) is different because it does not cause permanent damage. A TIA is a short-lived problem of poor blood flow affecting a part of the brain. A TIA is also a serious problem because having a TIA greatly increases the chances of having a stroke. When symptoms first develop, you cannot know if the problem might be a stroke or a TIA. CAUSES  A stroke is caused by a decrease of oxygen supply to an area of your brain. It is usually the result of a small blood clot or collection of cholesterol or fat (plaque) that blocks blood flow in the brain. A stroke can also be caused by blocked or damaged carotid arteries.  RISK FACTORS  High blood pressure (hypertension).  High cholesterol.  Diabetes mellitus.  Heart disease.  The buildup of plaque in the blood vessels (peripheral artery disease or atherosclerosis).  The buildup of plaque in the blood vessels providing blood and oxygen to the brain (carotid artery stenosis).  An abnormal heart rhythm (atrial fibrillation).  Obesity.  Smoking.  Taking oral contraceptives (especially in combination with smoking).  Physical inactivity.  A diet high in fats, salt (sodium), and calories.  Alcohol use.  Use of illegal drugs (especially cocaine and  methamphetamine).  Being African American.  Being over the age of 35.  Family history of stroke.  Previous history of blood clots, stroke, TIA, or heart attack.  Sickle cell disease. SYMPTOMS  These symptoms usually develop suddenly, or may be newly present upon awakening from sleep:  Sudden weakness or numbness of the face, arm, or leg, especially on one side of the body.  Sudden trouble walking or difficulty moving arms or legs.  Sudden confusion.  Sudden personality changes.  Trouble speaking (aphasia) or understanding.  Difficulty swallowing.  Sudden trouble seeing in one or both eyes.  Double vision.  Dizziness.  Loss of balance or coordination.  Sudden severe headache with no known cause.  Trouble reading or writing. DIAGNOSIS  Your health care provider can often determine the presence or absence of a stroke based on your symptoms, history, and physical exam. Computed tomography (CT) of the brain is usually performed to confirm the stroke, determine causes, and determine stroke severity. Other tests may be done to find the cause of the stroke. These tests may include:  Electrocardiography.  Continuous heart monitoring.  Echocardiography.  Carotid ultrasonography.  Magnetic resonance imaging (MRI).  A scan of the brain circulation.  Blood tests. PREVENTION  The risk of a stroke can be decreased by appropriately treating high blood pressure, high cholesterol, diabetes, heart disease, and obesity and by quitting smoking, limiting alcohol, and staying physically active. TREATMENT  Time is of the essence. It is important to seek treatment at the first sign of these symptoms because you may receive a medicine  to dissolve the clot (thrombolytic) that cannot be given if too much time has passed since your symptoms began. Even if you do not know when your symptoms began, get treatment as soon as possible as there are other treatment options available including  oxygen, intravenous (IV) fluids, and medicines to thin the blood (anticoagulants). Treatment of stroke depends on the duration, severity, and cause of your symptoms. Medicines and dietary changes may be used to address diabetes, high blood pressure, and other risk factors. Physical, speech, and occupational therapists will assess you and work with you to improve any functions impaired by the stroke. Measures will be taken to prevent short-term and long-term complications, including infection from breathing foreign material into the lungs (aspiration pneumonia), blood clots in the legs, bedsores, and falls. Rarely, surgery may be needed to remove large blood clots or to open up blocked arteries. HOME CARE INSTRUCTIONS   Take medicines only as directed by your health care provider. Follow the directions carefully. Medicines may be used to control risk factors for a stroke. Be sure you understand all your medicine instructions.  You may be told to take a medicine to thin the blood, such as aspirin or the anticoagulant warfarin. Warfarin needs to be taken exactly as instructed.  Too much and too little warfarin are both dangerous. Too much warfarin increases the risk of bleeding. Too little warfarin continues to allow the risk for blood clots. While taking warfarin, you will need to have regular blood tests to measure your blood clotting time. These blood tests usually include both the PT and INR tests. The PT and INR results allow your health care provider to adjust your dose of warfarin. The dose can change for many reasons. It is critically important that you take warfarin exactly as prescribed, and that you have your PT and INR levels drawn exactly as directed.  Many foods, especially foods high in vitamin K, can interfere with warfarin and affect the PT and INR results. Foods high in vitamin K include spinach, kale, broccoli, cabbage, collard and turnip greens, brussels sprouts, peas, cauliflower, seaweed,  and parsley, as well as beef and pork liver, green tea, and soybean oil. You should eat a consistent amount of foods high in vitamin K. Avoid major changes in your diet, or notify your health care provider before changing your diet. Arrange a visit with a dietitian to answer your questions.  Many medicines can interfere with warfarin and affect the PT and INR results. You must tell your health care provider about any and all medicines you take. This includes all vitamins and supplements. Be especially cautious with aspirin and anti-inflammatory medicines. Do not take or discontinue any prescribed or over-the-counter medicine except on the advice of your health care provider or pharmacist.  Warfarin can have side effects, such as excessive bruising or bleeding. You will need to hold pressure over cuts for longer than usual. Your health care provider or pharmacist will discuss other potential side effects.  Avoid sports or activities that may cause injury or bleeding.  Be mindful when shaving, flossing your teeth, or handling sharp objects.  Alcohol can change the body's ability to handle warfarin. It is best to avoid alcoholic drinks or consume only very small amounts while taking warfarin. Notify your health care provider if you change your alcohol intake.  Notify your dentist or other health care providers before procedures.  If swallow studies have determined that your swallowing reflex is present, you should eat healthy foods. Including  5 or more servings of fruits and vegetables a day may reduce the risk of stroke. Foods may need to be a certain consistency (soft or pureed), or small bites may need to be taken in order to avoid aspirating or choking. Certain dietary changes may be advised to address high blood pressure, high cholesterol, diabetes, or obesity.  Food choices that are low in sodium, saturated fat, trans fat, and cholesterol are recommended to manage high blood pressure.  Food  choies that are high in fiber, and low in saturated fat, trans fat, and cholesterol may control cholesterol levels.  Controlling carbohydrates and sugar intake is recommended to manage diabetes.  Reducing calorie intake and making food choices that are low in sodium, saturated fat, trans fat, and cholesterol are recommended to manage obesity.  Maintain a healthy weight.  Stay physically active. It is recommended that you get at least 30 minutes of activity on all or most days.  Do not use any tobacco products including cigarettes, chewing tobacco, or electronic cigarettes.  Limit alcohol use even if you are not taking warfarin. Moderate alcohol use is considered to be:  No more than 2 drinks each day for men.  No more than 1 drink each day for nonpregnant women.  Home safety. A safe home environment is important to reduce the risk of falls. Your health care provider may arrange for specialists to evaluate your home. Having grab bars in the bedroom and bathroom is often important. Your health care provider may arrange for equipment to be used at home, such as raised toilets and a seat for the shower.  Physical, occupational, and speech therapy. Ongoing therapy may be needed to maximize your recovery after a stroke. If you have been advised to use a walker or a cane, use it at all times. Be sure to keep your therapy appointments.  Follow all instructions for follow-up with your health care provider. This is very important. This includes any referrals, physical therapy, rehabilitation, and lab tests. Proper follow-up can prevent another stroke from occurring. SEEK MEDICAL CARE IF:  You have personality changes.  You have difficulty swallowing.  You are seeing double.  You have dizziness.  You have a fever.  You have skin breakdown. SEEK IMMEDIATE MEDICAL CARE IF:  Any of these symptoms may represent a serious problem that is an emergency. Do not wait to see if the symptoms will go  away. Get medical help right away. Call your local emergency services (911 in [place] Do not drive yourself to the hospital.  You have sudden weakness or numbness of the face, arm, or leg, especially on one side of the body.  You have sudden trouble walking or difficulty moving arms or legs.  You have sudden confusion.  You have trouble speaking (aphasia) or understanding.  You have sudden trouble seeing in one or both eyes.  You have a loss of balance or coordination.  You have a sudden, severe headache with no known cause.  You have new chest pain or an irregular heartbeat.  You have a partial or total loss of consciousness. Document Released: 12/29/2004 Document Revised: 05/15/2013 Document Reviewed: 08/09/2011 [facility] Patient Information 2015 [place], [place]. This information is not intended to replace advice given to you by your health care provider. Make sure you discuss any questions you have with your health care provider.

## 2013-11-22 NOTE — Progress Notes (Signed)
PATIENT: Audrey Carr DOB: Jan 18, 1940  REASON FOR VISIT: hospital follow up for stroke, memory disorder HISTORY FROM: patient, nephew  HISTORY OF PRESENT ILLNESS: Audrey Carr is an 73 y.o. female who comes to the office for first hospital follow up post hospital discharge for stroke.She is accompanied today by her nephew who is her POA.  She has a history diabetes mellitus, hypertension and hyperlipidemia presenting with new onset left lower facial droop which was first noticed about 8 PM 10/11/2013. Patient had also developed slurring of speech. She had no difficulty with swallowing. She had no left upper or left lower extremity weakness, no numbness. Has no previous history of stroke or TIA. She had not been on antiplatelet therapy. MRI of her brain showed moderate size acute right MCA infarction. 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 Carotid Doppler showed bilateral 1-39% ICA stenosis. LDL 194, HgbA1c 10.8. The estimated ejection fraction was in the range of 60% to 65% on 2D Echo.  NIH stroke score was 3. Patient was not administered TPA secondary to delay in arrival. She was admitted for further evaluation and treatment. LDL was 194, total cholesterol 281 and HgbA1c was 10.8.She was discharged to [facility] where her husband resides. They are sharing a room. Nephew shares that his aunt has been showing signs of dementia for some time, not keeping the house as she used to, and he suspects she had stopped taking her medications. She has tried to leave the rehab facility twice. He was told that she could only stay at [facility] if they hires a Charity fundraiser, because they did not have room in the memory care unit. Nephew is trying to facilitate making the patient's home safe to return to with 24-hr caregiver. No residual effects from stroke.  REVIEW OF SYSTEMS: Full 14 system  review of systems performed and notable only for: no complaints.Geriatric Depression score 6, suggests Depression.  ALLERGIES: No Known Allergies  HOME MEDICATIONS: Outpatient Prescriptions Prior to Visit  Medication Sig Dispense Refill  . amLODipine (NORVASC) 5 MG tablet Take 5 mg by mouth daily.    Marland Kitchen atorvastatin (LIPITOR) 40 MG tablet Take 2 tablets (80 mg total) by mouth daily.    . clopidogrel (PLAVIX) 75 MG tablet Take 1 tablet (75 mg total) by mouth daily.    Marland Kitchen glipiZIDE (GLUCOTROL XL) 10 MG 24 hr tablet Take 10 mg by mouth daily with breakfast.    . insulin aspart (NOVOLOG) 100 UNIT/ML injection 0-9 Units, Subcutaneous, 3 times daily with meals CBG < 70: implement hypoglycemia protocol CBG 70 - 120: 0 units CBG 121 - 150: 1 unit CBG 151 - 200: 2 units CBG 201 - 250: 3 units CBG 251 - 300: 5 units CBG 301 - 350: 7 units CBG 351 - 400: 9 units CBG > 400: call MD 10 mL 11  . metFORMIN (GLUCOPHAGE) 500 MG tablet Take 500 mg by mouth 2 (two) times daily with a meal.    . escitalopram (LEXAPRO) 10 MG tablet Take 10 mg by mouth daily.     No facility-administered medications prior to visit.    PHYSICAL EXAM Filed Vitals:   11/22/13 1440  BP: 156/87  Pulse: 73  Height: 5\' 4"  (1.626 m)  Weight: 140 lb 8 oz (63.73 kg)   Body mass index is 24.1 kg/(m^2).  MMSE - Mini Mental State Exam 11/22/2013  Orientation to time 2  Orientation to Place 4  Registration 3  Attention/ Calculation 2  Recall 3  Language- name 2 objects 2  Language- repeat 1  Language- follow 3 step command 3  Language- read & follow direction 1  Write a sentence 1  Copy design 1  Total score 23    Generalized: Well developed, in no acute distress Head: normocephalic and atraumatic. Oropharynx benign  Neck: Supple, no carotid bruits  Cardiac: Regular rate rhythm, no murmur  Musculoskeletal: No deformity   Neurological examination  Mentation: Alert oriented to self and place. Follows all commands  speech and language fluent. Recent and remote memory is diminished.  Affect is flat, depressed mood. Cranial nerve II-XII: Fundoscopic exam not done. Pupils were equal round reactive to light extraocular movements were full, visual field were full on confrontational test. Facial sensation and strength were normal. hearing was intact to finger rubbing bilaterally. Uvula tongue midline. head turning and shoulder shrug and were normal and symmetric.Tongue protrusion into cheek strength was normal. Motor: The motor testing reveals 5 over 5 strength of all 4 extremities. Good symmetric motor tone is noted throughout.  Sensory: Sensory testing is intact to soft touch on all 4 extremities. No evidence of extinction is noted.  Coordination: Cerebellar testing reveals good finger-nose-finger and heel-to-shin bilaterally.  Gait and station: Gait is normal. Tandem gait is normal. Romberg is negative. Reflexes: Deep tendon reflexes are symmetric and normal bilaterally.  NIHSS: 0 MRs: 1  ASSESSMENT: Ms. Audrey Carr is a 73 y.o. female with history of diabetes mellitus, hypertension, hyperlipidemia and likely undiagnosed memory disorder presenting with new onset left lower facial droop and dysarthria on 10/11/2013. She did not receive IV t-PA due to delay in arrival. MRI imaging confirmed a right MCA infarct due to moderate to severe R ICA atherosclerosis. Vascular risk factors of Diabetes, uncontrolled, Hyperlipidemia, former smoker, advanced age, and family history of stroke (mother). No residual effects from stroke.  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  PLAN: I had a long discussion with the patient and nephew regarding her recent stroke, discussed results of evaluation in the hospital and answered questions.  Continue clopidogrel 75 mg orally every day  for secondary stroke prevention and maintain strict control of hypertension with blood pressure goal below 130/90, diabetes with hemoglobin A1c  goal below 6.5% and lipids with LDL cholesterol goal below 100 mg/dL.  I recommended to increase Lexapro to 20 mg daily for depression.  Start Aricept 5 mg daily and increase to 10 mg daily after 1 month. Followup in the future with Dr.Xu in 1-2 months, sooner as needed.  Meds ordered this encounter  Medications  . escitalopram (LEXAPRO) 10 MG tablet    Sig: Take 2 tablets (20 mg total) by mouth daily.    Dispense:  60 tablet    Refill:  5    Order Specific Question:  Supervising Provider    Answer:  Leonie Man, PRAMOD [[id number]]  . donepezil (ARICEPT) 5 MG tablet    Sig: Take 1 tablet (5 mg total) by mouth at bedtime.    Dispense:  30 tablet    Refill:  0    Order Specific Question:  Supervising Provider    Answer:  Leonie Man, PRAMOD [[id number]]  . donepezil (ARICEPT) 10 MG tablet    Sig: Take 1 tablet (10 mg total) by mouth at bedtime. Start 1 month after starting 5 mg daily.    Dispense:  30 tablet    Refill:  3  Order Specific Question:  Supervising Provider    Answer:  Antony Contras [[id number]]   Return in about 1 month (around 12/22/2013) for stroke followup.  Rudi Rummage Rosaly Labarbera, MSN, FNP-BC, A/GNP-C 11/24/2013, 9:09 AM [facility] [address] [place], [place] [address] [phone]  Note: This document was prepared with digital dictation and possible smart phrase technology. Any transcriptional errors that result from this process are unintentional.

## 2013-11-24 ENCOUNTER — Ambulatory Visit (INDEPENDENT_AMBULATORY_CARE_PROVIDER_SITE_OTHER): Payer: Medicare Other | Admitting: Cardiology

## 2013-11-24 ENCOUNTER — Encounter: Payer: Self-pay | Admitting: Cardiology

## 2013-11-24 VITALS — BP 122/72 | HR 71 | Ht 64.0 in | Wt 141.0 lb

## 2013-11-24 DIAGNOSIS — I639 Cerebral infarction, unspecified: Secondary | ICD-10-CM

## 2013-11-24 DIAGNOSIS — E785 Hyperlipidemia, unspecified: Secondary | ICD-10-CM | POA: Diagnosis not present

## 2013-11-24 DIAGNOSIS — I10 Essential (primary) hypertension: Secondary | ICD-10-CM

## 2013-11-24 DIAGNOSIS — Z79899 Other long term (current) drug therapy: Secondary | ICD-10-CM

## 2013-11-24 DIAGNOSIS — I5032 Chronic diastolic (congestive) heart failure: Secondary | ICD-10-CM

## 2013-11-24 LAB — LIPID PANEL
Cholesterol: 211 mg/dL — ABNORMAL HIGH (ref 0–200)
HDL: 43.5 mg/dL (ref >39.00)
NonHDL: 167.5
Total CHOL/HDL Ratio: 5
Triglycerides: 202 mg/dL — ABNORMAL HIGH (ref 0.0–149.0)
VLDL: 40.4 mg/dL — ABNORMAL HIGH (ref 0.0–40.0)

## 2013-11-24 LAB — LDL CHOLESTEROL, DIRECT: Direct LDL: 149.7 mg/dL

## 2013-11-24 LAB — ALT: ALT: 11 U/L (ref 0–35)

## 2013-11-24 NOTE — Progress Notes (Signed)
[facility]. [address], [address] [place], [place]  [address] Phone: [phone] Fax:  [phone]  Date:  11/24/2013   ID:  Audrey Carr, DOB 09-Jul-1940, MRN [id number]  PCP:  Jerlyn Ly, MD   History of Present Illness: Audrey Carr is a 73 y.o. female with diabetes, hypertension, hyperlipidemia with left lower facial droop, mild left-sided weakness with moderate sized right MCA infarction. Carotid Doppler mild plaque bilaterally. On 10/3, TEE thought to be not necessary according to neurology. No mention of loop recorder needed by Dr. Leonie Man in note.  Aspirin, Plavix for 3 months then Plavix alone. Hemoglobin A1c 10.8.  Doing well. No shortness of breath, no chest pain.  She was also diagnosed with chronic diastolic heart failure, BNP 218  LDL was 180's.  At [facility] Readings from Last 3 Encounters:  11/24/13 141 lb (63.957 kg)  11/22/13 140 lb 8 oz (63.73 kg)  10/16/13 129 lb 11.2 oz (58.832 kg)     Past Medical History  Diagnosis Date  . Diabetes mellitus without complication   . Stroke 10/12/2013    Past Surgical History  Procedure Laterality Date  . Abdominal hysterectomy      Current Outpatient Prescriptions  Medication Sig Dispense Refill  . amLODipine (NORVASC) 5 MG tablet Take 5 mg by mouth daily.    Marland Kitchen atorvastatin (LIPITOR) 40 MG tablet Take 2 tablets (80 mg total) by mouth daily.    . clopidogrel (PLAVIX) 75 MG tablet Take 1 tablet (75 mg total) by mouth daily.    Marland Kitchen escitalopram (LEXAPRO) 10 MG tablet Take 2 tablets (20 mg total) by mouth daily. 60 tablet 5  . glipiZIDE (GLUCOTROL XL) 10 MG 24 hr tablet Take 10 mg by mouth daily with breakfast.    . insulin aspart (NOVOLOG) 100 UNIT/ML injection 0-9 Units, Subcutaneous, 3 times daily with meals CBG < 70: implement hypoglycemia protocol CBG 70 - 120: 0 units CBG 121 - 150: 1 unit CBG 151 - 200: 2 units CBG 201 - 250: 3 units CBG 251 - 300: 5 units CBG 301 - 350: 7 units CBG 351 -  400: 9 units CBG > 400: call MD 10 mL 11  . metFORMIN (GLUCOPHAGE) 500 MG tablet Take 500 mg by mouth 2 (two) times daily with a meal.     No current facility-administered medications for this visit.    Allergies:   No Known Allergies  Social History:  The patient  reports that she has been smoking Cigarettes.  She has been smoking about 1.00 pack per day. She has never used smokeless tobacco. She reports that she does not drink alcohol or use illicit drugs.   Family History  Problem Relation Age of Onset  . Stroke Mother     ROS:  Please see the history of present illness.   Denies any fevers, chills, orthopnea, PND, chest pain, shortness of breath.   All other systems reviewed and negative.   PHYSICAL EXAM: VS:  BP 122/72 mmHg  Pulse 71  Ht 5\' 4"  (1.626 m)  Wt 141 lb (63.957 kg)  BMI 24.19 kg/m2 Well nourished, well developed, in no acute distress HEENT: normal, [place]/AT, EOMI Neck: no JVD, normal carotid upstroke, no bruit Cardiac:  normal S1, S2; RRR; 2/6 SM murmur Lungs:  clear to auscultation bilaterally, no wheezing, rhonchi or rales Abd: soft, nontender, no hepatomegaly, no bruits Ext: no edema, 2+ distal pulses Skin: warm and dry GU: deferred  Neuro: no focal abnormalities noted, AAO x 3  EKG:  10/12/13: NSR no changes.   ECHO: 10/13/13: normal EF, aortic sclerosis ASSESSMENT AND PLAN:  1. 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 Ambulating well. Plavix. Neurology note reviewed. No TEE/loop recorder.  2. Hyperlipidemia-I will check lipid profile. ALT. Continue atorvastatin 40. 3. Hypertension-well controlled. 4. Chronic diastolic heart failure-controlled. 5. When necessary follow-up. If there is anything else in particular that is needed from cardiology perspective, please let me know.  Signed, Candee Furbish, MD [facility]  11/24/2013 3:25 PM

## 2013-11-24 NOTE — Patient Instructions (Signed)
The current medical regimen is effective;  continue present plan and medications.  Please have blood work today. (lipid, alt)  Follow up as needed.  Thank you for choosing [facility]!!

## 2013-12-13 ENCOUNTER — Encounter: Payer: Self-pay | Admitting: *Deleted

## 2014-01-15 DIAGNOSIS — B351 Tinea unguium: Secondary | ICD-10-CM | POA: Diagnosis not present

## 2014-01-15 DIAGNOSIS — R260 Ataxic gait: Secondary | ICD-10-CM | POA: Diagnosis not present

## 2014-01-15 DIAGNOSIS — L600 Ingrowing nail: Secondary | ICD-10-CM | POA: Diagnosis not present

## 2014-01-15 DIAGNOSIS — I70203 Unspecified atherosclerosis of native arteries of extremities, bilateral legs: Secondary | ICD-10-CM | POA: Diagnosis not present

## 2014-01-15 DIAGNOSIS — M79674 Pain in right toe(s): Secondary | ICD-10-CM | POA: Diagnosis not present

## 2014-01-15 DIAGNOSIS — M79675 Pain in left toe(s): Secondary | ICD-10-CM | POA: Diagnosis not present

## 2014-01-22 ENCOUNTER — Encounter: Payer: Self-pay | Admitting: Neurology

## 2014-01-22 ENCOUNTER — Ambulatory Visit (INDEPENDENT_AMBULATORY_CARE_PROVIDER_SITE_OTHER): Payer: Medicare Other | Admitting: Neurology

## 2014-01-22 VITALS — BP 153/80 | HR 63 | Ht 64.0 in | Wt 147.2 lb

## 2014-01-22 DIAGNOSIS — I63311 Cerebral infarction due to thrombosis of right middle cerebral artery: Secondary | ICD-10-CM

## 2014-01-22 DIAGNOSIS — E1159 Type 2 diabetes mellitus with other circulatory complications: Secondary | ICD-10-CM | POA: Diagnosis not present

## 2014-01-22 DIAGNOSIS — I10 Essential (primary) hypertension: Secondary | ICD-10-CM

## 2014-01-22 DIAGNOSIS — E785 Hyperlipidemia, unspecified: Secondary | ICD-10-CM

## 2014-01-22 NOTE — Patient Instructions (Addendum)
Continue Plavix and lipitor for stroke prevention Will add lopid for further lipid control (facility to do)  Consider 30 day cardiac event monitoring to rule out arrhythmia (facility to order) Follow up with your primary care physician for stroke risk factor modification. Recommend maintain blood pressure goal <130/80, diabetes with hemoglobin A1c goal below 6.5% and lipids with LDL cholesterol goal below 70 mg/dL.  Follow up with your cardiologist Check BP and sugar at facility Follow up in 3-4 months.

## 2014-01-22 NOTE — Progress Notes (Signed)
STROKE NEUROLOGY FOLLOW UP NOTE  NAME: Audrey Carr DOB: [birth year]  REASON FOR VISIT: stroke follow up HISTORY FROM: chart  Today we had the pleasure of seeing Audrey Carr in follow-up at our Neurology Clinic. Pt was accompanied by CMA from NH.   History Summary 74 yo F with history diabetes mellitus, hypertension and hyperlipidemia was admitted on 10/11/13 for new onset left lower facial droop and slurring of speech, no weakness, no numbness. MRI of her brain showed moderate size acute right MCA infarction. 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 Carotid Doppler showed bilateral 1-39% ICA stenosis. LDL 194, HgbA1c 10.8.  EF 60% to 65% on 2D Echo.Her stroke was considered as large vessel atherosclerosis but embolic can not be ruled out. She was put on dural antiplatelet as well as 80mg  lipitor. She was discharged to [facility] where her husband resides.   Follow up 11/22/13 - Nephew shares that his aunt has been showing signs of dementia for some time, not keeping the house as she used to, and he suspects she had stopped taking her medications. She has tried to leave the rehab facility twice. He was told that she could only stay at [facility] if they hires a Charity fundraiser, because they did not have room in the memory care unit. Nephew is trying to facilitate making the patient's home safe to return to with 24-hr caregiver. No residual effects from stroke.  Interval History During the interval time, the patient has been doing relatively well. She lost her husband in Nov 2015. She felt some depression, but she denies SI or HI. She stated that she does not think she has dementia but everyone else think so. She is on aricept.   She stated that her sugar level normally at 170-180, on SSI with glipizide and metformin. BP today 153/80, she is on norvasc. She stated that she was  never on dural antiplatelet. Currently on plavix and lipitor, no side effects.  REVIEW OF SYSTEMS: Full 14 system review of systems performed and notable only for those listed below and in HPI above, all others are negative:  Constitutional: N/A  Cardiovascular: N/A  Ear/Nose/Throat: N/A  Skin: N/A  Eyes: N/A  Respiratory: N/A  Gastroitestinal: constipation  Genitourinary: N/A Hematology/Lymphatic: N/A  Endocrine: N/A  Musculoskeletal: N/A  Allergy/Immunology: N/A  Neurological: N/A  Psychiatric: N/A  The following represents the patient's updated allergies and side effects list: No Known Allergies  The neurologically relevant items on the patient's problem list were reviewed on today's visit.  Neurologic Examination  A problem focused neurological exam (12 or more points of the single system neurologic examination, vital signs counts as 1 point, cranial nerves count for 8 points) was performed.  Blood pressure 153/80, pulse 63, height 5\' 4"  (1.626 m), weight 147 lb 3.2 oz (66.769 kg).  General - Well nourished, well developed, in no apparent distress.  Ophthalmologic - not able to see through due to small pupils.  Cardiovascular - Regular rate and rhythm with no murmur.  Mental Status -  Level of arousal and orientation to time, place, and person were intact. Language including expression, naming, repetition, comprehension was assessed and found intact. Recent and remote memory were 3/3 registration but 0.5/3 delayed recall. Fund of Knowledge was assessed and was impaired, no president 1/5.  Cranial Nerves II - XII - II - Visual field intact OU. III, IV, VI - Extraocular  movements intact. V - Facial sensation intact bilaterally. VII - Facial movement intact bilaterally. VIII - Hearing & vestibular intact bilaterally. X - Palate elevates symmetrically. XI - Chin turning & shoulder shrug intact bilaterally. XII - Tongue protrusion intact.  Motor Strength - The  patient's strength was normal in all extremities and pronator drift was absent.  Bulk was normal and fasciculations were absent.   Motor Tone - Muscle tone was assessed at the neck and appendages and was normal.  Reflexes - The patient's reflexes were normal in all extremities and she had no pathological reflexes.  Sensory - Light touch, temperature/pinprick and Romberg testing were assessed and were normal.    Coordination - The patient had normal movements in the hands and feet with no ataxia or dysmetria.  Tremor was absent.  Gait and Station - The patient's transfers, posture, gait, station, and turns were observed as normal.  Data reviewed: I personally reviewed the images and agree with the radiology interpretations.  Dg Chest 2 View 10/13/2013 No active cardiopulmonary disease.   Ct Head Wo Contrast 10/12/2013 1. There is no acute intracranial hemorrhage nor objective evidence of acute ischemic change. 2. There is decreased density in the deep white matter of both cerebral hemispheres, greatest in the right frontal region, consistent with chronic small vessel ischemic change.   Mri & Mra Brain Wo Contrast 10/12/2013 1. Moderate-sized, acute right MCA infarct. 2. Mild chronic small vessel ischemic disease and cerebral atrophy. Scattered, VIII cerebral micro hemorrhages. 3. Moderately to severely motion degraded head MRA. Right M2 superior division occlusion just beyond its origin. 4. 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   2D echo - Left ventricle: The cavity size was normal. Wall thickness was normal. Systolic function was normal. The estimated ejection fraction was in the range of 60% to 65%. Doppler parameters are consistent with abnormal left ventricular relaxation (grade 1 diastolic dysfunction).  CUS -1-39% bilateral ICA stenosis  Component     Latest Ref Rng 10/13/2013 11/24/2013  Cholesterol     0 - 200 mg/dL 281 (H) 211 (H)    Triglycerides     0.0 - 149.0 mg/dL 141 202.0 (H)  HDL     >39.00 mg/dL 59 43.50  Total CHOL/HDL Ratio      4.8 5  VLDL     0.0 - 40.0 mg/dL 28 40.4 (H)  LDL (calc)     0 - 99 mg/dL 194 (H)   NonHDL       167.50  Hgb A1c MFr Bld     <5.7 % 10.8 (H)   Mean Plasma Glucose     <117 mg/dL 263 (H)   Direct LDL       149.7    Assessment: As you may recall, she is a 74 y.o. African American female with PMH of HTN, DM, HLD was admitted on 10/11/13 for right MCA stroke, moderate size, MRA showed right M2 occlusion. Her LDL was 194 and A1C 10.8 and smoking at that time. Her stroke was considered as large vessel asthero but embolic stroke not able to rule out. Repeat LDL in 11/2013 still high as well as TG although on lipitor 80. Will recommend to add lopid for further control. Also recommend to have 30 day cardiac event monitoring to rule out afib. Continue aricept for cognitive impairment.  Plan:  - continue plavix and lipitor for stroke prevention - recommend to add lopid for further lipid control  - recommend 30 day cardiac event  monitoring to rule out afib - Follow up with your primary care physician for stroke risk factor modification. Recommend maintain blood pressure goal <130/80, diabetes with hemoglobin A1c goal below 6.5% and lipids with LDL cholesterol goal below 70 mg/dL.  - follow up with cardiology regularly - check BP and glucose at NH - RTC in 3-4 months.  No orders of the defined types were placed in this encounter.    Meds ordered this encounter  Medications  . donepezil (ARICEPT) 10 MG tablet    Sig: Take 10 mg by mouth at bedtime.    Patient Instructions  Continue Plavix and lipitor for stroke prevention Will add lopid for further lipid control (facility to do)  Consider 30 day cardiac event monitoring to rule out arrhythmia (facility to order) Follow up with your primary care physician for stroke risk factor modification. Recommend maintain blood pressure goal  <130/80, diabetes with hemoglobin A1c goal below 6.5% and lipids with LDL cholesterol goal below 70 mg/dL.  Follow up with your cardiologist Check BP and sugar at facility Follow up in 3-4 months.   Rosalin Hawking, MD PhD [facility] [address] [place], [place] [address] [phone]

## 2014-01-24 DIAGNOSIS — E119 Type 2 diabetes mellitus without complications: Secondary | ICD-10-CM | POA: Diagnosis not present

## 2014-01-24 DIAGNOSIS — I10 Essential (primary) hypertension: Secondary | ICD-10-CM | POA: Diagnosis not present

## 2014-01-24 DIAGNOSIS — E785 Hyperlipidemia, unspecified: Secondary | ICD-10-CM | POA: Diagnosis not present

## 2014-01-24 DIAGNOSIS — Z79899 Other long term (current) drug therapy: Secondary | ICD-10-CM | POA: Diagnosis not present

## 2014-01-24 DIAGNOSIS — D649 Anemia, unspecified: Secondary | ICD-10-CM | POA: Diagnosis not present

## 2014-01-25 ENCOUNTER — Encounter (HOSPITAL_COMMUNITY): Payer: Self-pay | Admitting: Internal Medicine

## 2014-01-29 ENCOUNTER — Telehealth: Payer: Self-pay | Admitting: Neurology

## 2014-02-07 NOTE — Progress Notes (Signed)
I reviewed above note and agree with the assessment and plan.  Rosalin Hawking, MD PhD Stroke Neurology 02/07/2014 12:34 PM

## 2014-02-08 NOTE — Telephone Encounter (Signed)
When pt was seen on 1/11 you wanted her to start Lopid 600 mg twice a day, but the pharmacy said that it would interact with other medications and they faxed over an order on 1/18 and 1/21 and has not got a response. Please advise.  It was faxed to [phone] and [phone].

## 2014-02-09 ENCOUNTER — Other Ambulatory Visit: Payer: Self-pay | Admitting: Neurology

## 2014-02-09 DIAGNOSIS — E785 Hyperlipidemia, unspecified: Secondary | ICD-10-CM

## 2014-02-09 MED ORDER — EZETIMIBE 10 MG PO TABS: 10.0000 mg | ORAL_TABLET | Freq: Every day | ORAL | Status: DC

## 2014-02-09 NOTE — Telephone Encounter (Signed)
[facility] and talked with pharmacist there. It seems that lopid has interaction with lipitor and make pt has higher risk for rhabdo. However, zetia has no such interaction.   I called the [facility] and gave the verbal order of zetia 10mg  daily to replace the lopid to the nursing who is taking care of this pt and she will relay the information to the ordering MD.   Rosalin Hawking, MD PhD Stroke Neurology 02/09/2014 5:09 PM

## 2014-03-13 DIAGNOSIS — Z111 Encounter for screening for respiratory tuberculosis: Secondary | ICD-10-CM | POA: Diagnosis not present

## 2014-03-16 DIAGNOSIS — Z7901 Long term (current) use of anticoagulants: Secondary | ICD-10-CM | POA: Diagnosis not present

## 2014-03-16 DIAGNOSIS — Z720 Tobacco use: Secondary | ICD-10-CM | POA: Diagnosis not present

## 2014-03-16 DIAGNOSIS — E785 Hyperlipidemia, unspecified: Secondary | ICD-10-CM | POA: Diagnosis not present

## 2014-03-16 DIAGNOSIS — I69354 Hemiplegia and hemiparesis following cerebral infarction affecting left non-dominant side: Secondary | ICD-10-CM | POA: Diagnosis not present

## 2014-03-16 DIAGNOSIS — I6931 Cognitive deficits following cerebral infarction: Secondary | ICD-10-CM | POA: Diagnosis not present

## 2014-03-16 DIAGNOSIS — E119 Type 2 diabetes mellitus without complications: Secondary | ICD-10-CM | POA: Diagnosis not present

## 2014-03-16 DIAGNOSIS — I10 Essential (primary) hypertension: Secondary | ICD-10-CM | POA: Diagnosis not present

## 2014-03-16 DIAGNOSIS — Z794 Long term (current) use of insulin: Secondary | ICD-10-CM | POA: Diagnosis not present

## 2014-03-19 DIAGNOSIS — E785 Hyperlipidemia, unspecified: Secondary | ICD-10-CM | POA: Diagnosis not present

## 2014-03-19 DIAGNOSIS — E559 Vitamin D deficiency, unspecified: Secondary | ICD-10-CM | POA: Diagnosis not present

## 2014-03-19 DIAGNOSIS — E039 Hypothyroidism, unspecified: Secondary | ICD-10-CM | POA: Diagnosis not present

## 2014-03-19 DIAGNOSIS — I10 Essential (primary) hypertension: Secondary | ICD-10-CM | POA: Diagnosis not present

## 2014-03-19 DIAGNOSIS — Z79899 Other long term (current) drug therapy: Secondary | ICD-10-CM | POA: Diagnosis not present

## 2014-03-19 DIAGNOSIS — Z720 Tobacco use: Secondary | ICD-10-CM | POA: Diagnosis not present

## 2014-03-19 DIAGNOSIS — D649 Anemia, unspecified: Secondary | ICD-10-CM | POA: Diagnosis not present

## 2014-03-19 DIAGNOSIS — I69354 Hemiplegia and hemiparesis following cerebral infarction affecting left non-dominant side: Secondary | ICD-10-CM | POA: Diagnosis not present

## 2014-03-19 DIAGNOSIS — I6931 Cognitive deficits following cerebral infarction: Secondary | ICD-10-CM | POA: Diagnosis not present

## 2014-03-19 DIAGNOSIS — E119 Type 2 diabetes mellitus without complications: Secondary | ICD-10-CM | POA: Diagnosis not present

## 2014-03-21 DIAGNOSIS — I69354 Hemiplegia and hemiparesis following cerebral infarction affecting left non-dominant side: Secondary | ICD-10-CM | POA: Diagnosis not present

## 2014-03-21 DIAGNOSIS — I6931 Cognitive deficits following cerebral infarction: Secondary | ICD-10-CM | POA: Diagnosis not present

## 2014-03-21 DIAGNOSIS — E785 Hyperlipidemia, unspecified: Secondary | ICD-10-CM | POA: Diagnosis not present

## 2014-03-21 DIAGNOSIS — Z720 Tobacco use: Secondary | ICD-10-CM | POA: Diagnosis not present

## 2014-03-21 DIAGNOSIS — E119 Type 2 diabetes mellitus without complications: Secondary | ICD-10-CM | POA: Diagnosis not present

## 2014-03-21 DIAGNOSIS — I10 Essential (primary) hypertension: Secondary | ICD-10-CM | POA: Diagnosis not present

## 2014-03-22 DIAGNOSIS — E119 Type 2 diabetes mellitus without complications: Secondary | ICD-10-CM | POA: Diagnosis not present

## 2014-03-22 DIAGNOSIS — B351 Tinea unguium: Secondary | ICD-10-CM | POA: Diagnosis not present

## 2014-03-22 DIAGNOSIS — L03031 Cellulitis of right toe: Secondary | ICD-10-CM | POA: Diagnosis not present

## 2014-03-23 DIAGNOSIS — Z720 Tobacco use: Secondary | ICD-10-CM | POA: Diagnosis not present

## 2014-03-23 DIAGNOSIS — E785 Hyperlipidemia, unspecified: Secondary | ICD-10-CM | POA: Diagnosis not present

## 2014-03-23 DIAGNOSIS — I10 Essential (primary) hypertension: Secondary | ICD-10-CM | POA: Diagnosis not present

## 2014-03-23 DIAGNOSIS — I69354 Hemiplegia and hemiparesis following cerebral infarction affecting left non-dominant side: Secondary | ICD-10-CM | POA: Diagnosis not present

## 2014-03-23 DIAGNOSIS — I6931 Cognitive deficits following cerebral infarction: Secondary | ICD-10-CM | POA: Diagnosis not present

## 2014-03-23 DIAGNOSIS — E119 Type 2 diabetes mellitus without complications: Secondary | ICD-10-CM | POA: Diagnosis not present

## 2014-03-26 DIAGNOSIS — I69354 Hemiplegia and hemiparesis following cerebral infarction affecting left non-dominant side: Secondary | ICD-10-CM | POA: Diagnosis not present

## 2014-03-26 DIAGNOSIS — E119 Type 2 diabetes mellitus without complications: Secondary | ICD-10-CM | POA: Diagnosis not present

## 2014-03-26 DIAGNOSIS — E785 Hyperlipidemia, unspecified: Secondary | ICD-10-CM | POA: Diagnosis not present

## 2014-03-26 DIAGNOSIS — I10 Essential (primary) hypertension: Secondary | ICD-10-CM | POA: Diagnosis not present

## 2014-03-26 DIAGNOSIS — Z720 Tobacco use: Secondary | ICD-10-CM | POA: Diagnosis not present

## 2014-03-26 DIAGNOSIS — I6931 Cognitive deficits following cerebral infarction: Secondary | ICD-10-CM | POA: Diagnosis not present

## 2014-03-27 DIAGNOSIS — E119 Type 2 diabetes mellitus without complications: Secondary | ICD-10-CM | POA: Diagnosis not present

## 2014-03-27 DIAGNOSIS — I6931 Cognitive deficits following cerebral infarction: Secondary | ICD-10-CM | POA: Diagnosis not present

## 2014-03-27 DIAGNOSIS — E785 Hyperlipidemia, unspecified: Secondary | ICD-10-CM | POA: Diagnosis not present

## 2014-03-27 DIAGNOSIS — I10 Essential (primary) hypertension: Secondary | ICD-10-CM | POA: Diagnosis not present

## 2014-03-27 DIAGNOSIS — I69354 Hemiplegia and hemiparesis following cerebral infarction affecting left non-dominant side: Secondary | ICD-10-CM | POA: Diagnosis not present

## 2014-03-27 DIAGNOSIS — Z720 Tobacco use: Secondary | ICD-10-CM | POA: Diagnosis not present

## 2014-04-02 DIAGNOSIS — E119 Type 2 diabetes mellitus without complications: Secondary | ICD-10-CM | POA: Diagnosis not present

## 2014-04-02 DIAGNOSIS — Z720 Tobacco use: Secondary | ICD-10-CM | POA: Diagnosis not present

## 2014-04-02 DIAGNOSIS — I69354 Hemiplegia and hemiparesis following cerebral infarction affecting left non-dominant side: Secondary | ICD-10-CM | POA: Diagnosis not present

## 2014-04-02 DIAGNOSIS — I10 Essential (primary) hypertension: Secondary | ICD-10-CM | POA: Diagnosis not present

## 2014-04-02 DIAGNOSIS — I6931 Cognitive deficits following cerebral infarction: Secondary | ICD-10-CM | POA: Diagnosis not present

## 2014-04-02 DIAGNOSIS — E785 Hyperlipidemia, unspecified: Secondary | ICD-10-CM | POA: Diagnosis not present

## 2014-04-03 DIAGNOSIS — I69354 Hemiplegia and hemiparesis following cerebral infarction affecting left non-dominant side: Secondary | ICD-10-CM | POA: Diagnosis not present

## 2014-04-03 DIAGNOSIS — I10 Essential (primary) hypertension: Secondary | ICD-10-CM | POA: Diagnosis not present

## 2014-04-03 DIAGNOSIS — Z720 Tobacco use: Secondary | ICD-10-CM | POA: Diagnosis not present

## 2014-04-03 DIAGNOSIS — E119 Type 2 diabetes mellitus without complications: Secondary | ICD-10-CM | POA: Diagnosis not present

## 2014-04-03 DIAGNOSIS — I6931 Cognitive deficits following cerebral infarction: Secondary | ICD-10-CM | POA: Diagnosis not present

## 2014-04-03 DIAGNOSIS — E785 Hyperlipidemia, unspecified: Secondary | ICD-10-CM | POA: Diagnosis not present

## 2014-04-04 DIAGNOSIS — Z720 Tobacco use: Secondary | ICD-10-CM | POA: Diagnosis not present

## 2014-04-04 DIAGNOSIS — E119 Type 2 diabetes mellitus without complications: Secondary | ICD-10-CM | POA: Diagnosis not present

## 2014-04-04 DIAGNOSIS — I6931 Cognitive deficits following cerebral infarction: Secondary | ICD-10-CM | POA: Diagnosis not present

## 2014-04-04 DIAGNOSIS — E785 Hyperlipidemia, unspecified: Secondary | ICD-10-CM | POA: Diagnosis not present

## 2014-04-04 DIAGNOSIS — I10 Essential (primary) hypertension: Secondary | ICD-10-CM | POA: Diagnosis not present

## 2014-04-04 DIAGNOSIS — I69354 Hemiplegia and hemiparesis following cerebral infarction affecting left non-dominant side: Secondary | ICD-10-CM | POA: Diagnosis not present

## 2014-04-09 DIAGNOSIS — I10 Essential (primary) hypertension: Secondary | ICD-10-CM | POA: Diagnosis not present

## 2014-04-09 DIAGNOSIS — I6931 Cognitive deficits following cerebral infarction: Secondary | ICD-10-CM | POA: Diagnosis not present

## 2014-04-09 DIAGNOSIS — E785 Hyperlipidemia, unspecified: Secondary | ICD-10-CM | POA: Diagnosis not present

## 2014-04-09 DIAGNOSIS — I69354 Hemiplegia and hemiparesis following cerebral infarction affecting left non-dominant side: Secondary | ICD-10-CM | POA: Diagnosis not present

## 2014-04-09 DIAGNOSIS — Z720 Tobacco use: Secondary | ICD-10-CM | POA: Diagnosis not present

## 2014-04-09 DIAGNOSIS — E119 Type 2 diabetes mellitus without complications: Secondary | ICD-10-CM | POA: Diagnosis not present

## 2014-04-11 DIAGNOSIS — I6931 Cognitive deficits following cerebral infarction: Secondary | ICD-10-CM | POA: Diagnosis not present

## 2014-04-11 DIAGNOSIS — E785 Hyperlipidemia, unspecified: Secondary | ICD-10-CM | POA: Diagnosis not present

## 2014-04-11 DIAGNOSIS — I69354 Hemiplegia and hemiparesis following cerebral infarction affecting left non-dominant side: Secondary | ICD-10-CM | POA: Diagnosis not present

## 2014-04-11 DIAGNOSIS — I10 Essential (primary) hypertension: Secondary | ICD-10-CM | POA: Diagnosis not present

## 2014-04-11 DIAGNOSIS — E119 Type 2 diabetes mellitus without complications: Secondary | ICD-10-CM | POA: Diagnosis not present

## 2014-04-11 DIAGNOSIS — Z720 Tobacco use: Secondary | ICD-10-CM | POA: Diagnosis not present

## 2014-04-18 DIAGNOSIS — E119 Type 2 diabetes mellitus without complications: Secondary | ICD-10-CM | POA: Diagnosis not present

## 2014-04-18 DIAGNOSIS — I10 Essential (primary) hypertension: Secondary | ICD-10-CM | POA: Diagnosis not present

## 2014-04-18 DIAGNOSIS — I69354 Hemiplegia and hemiparesis following cerebral infarction affecting left non-dominant side: Secondary | ICD-10-CM | POA: Diagnosis not present

## 2014-04-18 DIAGNOSIS — E785 Hyperlipidemia, unspecified: Secondary | ICD-10-CM | POA: Diagnosis not present

## 2014-04-18 DIAGNOSIS — Z720 Tobacco use: Secondary | ICD-10-CM | POA: Diagnosis not present

## 2014-04-18 DIAGNOSIS — I6931 Cognitive deficits following cerebral infarction: Secondary | ICD-10-CM | POA: Diagnosis not present

## 2014-05-02 DIAGNOSIS — I739 Peripheral vascular disease, unspecified: Secondary | ICD-10-CM | POA: Diagnosis not present

## 2014-05-02 DIAGNOSIS — G8190 Hemiplegia, unspecified affecting unspecified side: Secondary | ICD-10-CM | POA: Diagnosis not present

## 2014-05-02 DIAGNOSIS — Z6828 Body mass index (BMI) 28.0-28.9, adult: Secondary | ICD-10-CM | POA: Diagnosis not present

## 2014-05-02 DIAGNOSIS — E1151 Type 2 diabetes mellitus with diabetic peripheral angiopathy without gangrene: Secondary | ICD-10-CM | POA: Diagnosis not present

## 2014-05-02 DIAGNOSIS — I5032 Chronic diastolic (congestive) heart failure: Secondary | ICD-10-CM | POA: Diagnosis not present

## 2014-05-02 DIAGNOSIS — I10 Essential (primary) hypertension: Secondary | ICD-10-CM | POA: Diagnosis not present

## 2014-05-02 DIAGNOSIS — I638 Other cerebral infarction: Secondary | ICD-10-CM | POA: Diagnosis not present

## 2014-05-02 DIAGNOSIS — R32 Unspecified urinary incontinence: Secondary | ICD-10-CM | POA: Diagnosis not present

## 2014-05-02 DIAGNOSIS — E785 Hyperlipidemia, unspecified: Secondary | ICD-10-CM | POA: Diagnosis not present

## 2014-05-14 ENCOUNTER — Ambulatory Visit: Payer: Medicare Other | Admitting: Neurology

## 2014-05-16 ENCOUNTER — Encounter: Payer: Self-pay | Admitting: Neurology

## 2014-05-30 DIAGNOSIS — R269 Unspecified abnormalities of gait and mobility: Secondary | ICD-10-CM | POA: Diagnosis not present

## 2014-05-30 DIAGNOSIS — M6281 Muscle weakness (generalized): Secondary | ICD-10-CM | POA: Diagnosis not present

## 2014-05-31 DIAGNOSIS — R269 Unspecified abnormalities of gait and mobility: Secondary | ICD-10-CM | POA: Diagnosis not present

## 2014-05-31 DIAGNOSIS — M6281 Muscle weakness (generalized): Secondary | ICD-10-CM | POA: Diagnosis not present

## 2014-06-04 DIAGNOSIS — R269 Unspecified abnormalities of gait and mobility: Secondary | ICD-10-CM | POA: Diagnosis not present

## 2014-06-04 DIAGNOSIS — M6281 Muscle weakness (generalized): Secondary | ICD-10-CM | POA: Diagnosis not present

## 2014-06-06 DIAGNOSIS — M6281 Muscle weakness (generalized): Secondary | ICD-10-CM | POA: Diagnosis not present

## 2014-06-06 DIAGNOSIS — R269 Unspecified abnormalities of gait and mobility: Secondary | ICD-10-CM | POA: Diagnosis not present

## 2014-06-13 DIAGNOSIS — M6281 Muscle weakness (generalized): Secondary | ICD-10-CM | POA: Diagnosis not present

## 2014-06-13 DIAGNOSIS — R269 Unspecified abnormalities of gait and mobility: Secondary | ICD-10-CM | POA: Diagnosis not present

## 2014-06-14 DIAGNOSIS — R269 Unspecified abnormalities of gait and mobility: Secondary | ICD-10-CM | POA: Diagnosis not present

## 2014-06-14 DIAGNOSIS — M6281 Muscle weakness (generalized): Secondary | ICD-10-CM | POA: Diagnosis not present

## 2014-06-18 DIAGNOSIS — R269 Unspecified abnormalities of gait and mobility: Secondary | ICD-10-CM | POA: Diagnosis not present

## 2014-06-18 DIAGNOSIS — M6281 Muscle weakness (generalized): Secondary | ICD-10-CM | POA: Diagnosis not present

## 2014-06-19 DIAGNOSIS — M6281 Muscle weakness (generalized): Secondary | ICD-10-CM | POA: Diagnosis not present

## 2014-06-19 DIAGNOSIS — R269 Unspecified abnormalities of gait and mobility: Secondary | ICD-10-CM | POA: Diagnosis not present

## 2014-10-22 DIAGNOSIS — I638 Other cerebral infarction: Secondary | ICD-10-CM | POA: Diagnosis not present

## 2014-10-22 DIAGNOSIS — R159 Full incontinence of feces: Secondary | ICD-10-CM | POA: Diagnosis not present

## 2014-10-22 DIAGNOSIS — Z6828 Body mass index (BMI) 28.0-28.9, adult: Secondary | ICD-10-CM | POA: Diagnosis not present

## 2014-10-22 DIAGNOSIS — Z1389 Encounter for screening for other disorder: Secondary | ICD-10-CM | POA: Diagnosis not present

## 2014-10-22 DIAGNOSIS — E1151 Type 2 diabetes mellitus with diabetic peripheral angiopathy without gangrene: Secondary | ICD-10-CM | POA: Diagnosis not present

## 2014-11-05 DIAGNOSIS — Z23 Encounter for immunization: Secondary | ICD-10-CM | POA: Diagnosis not present

## 2015-02-18 DIAGNOSIS — I639 Cerebral infarction, unspecified: Secondary | ICD-10-CM | POA: Diagnosis not present

## 2015-02-18 DIAGNOSIS — S52124A Nondisplaced fracture of head of right radius, initial encounter for closed fracture: Secondary | ICD-10-CM | POA: Diagnosis not present

## 2015-02-19 DIAGNOSIS — E784 Other hyperlipidemia: Secondary | ICD-10-CM | POA: Diagnosis not present

## 2015-02-19 DIAGNOSIS — R159 Full incontinence of feces: Secondary | ICD-10-CM | POA: Diagnosis not present

## 2015-02-19 DIAGNOSIS — I7389 Other specified peripheral vascular diseases: Secondary | ICD-10-CM | POA: Diagnosis not present

## 2015-02-19 DIAGNOSIS — E1151 Type 2 diabetes mellitus with diabetic peripheral angiopathy without gangrene: Secondary | ICD-10-CM | POA: Diagnosis not present

## 2015-02-19 DIAGNOSIS — I10 Essential (primary) hypertension: Secondary | ICD-10-CM | POA: Diagnosis not present

## 2015-02-19 DIAGNOSIS — I638 Other cerebral infarction: Secondary | ICD-10-CM | POA: Diagnosis not present

## 2015-02-21 ENCOUNTER — Emergency Department (HOSPITAL_COMMUNITY): Payer: Medicare Other

## 2015-02-21 ENCOUNTER — Encounter (HOSPITAL_COMMUNITY): Payer: Self-pay | Admitting: Emergency Medicine

## 2015-02-21 ENCOUNTER — Emergency Department (HOSPITAL_COMMUNITY)
Admission: EM | Admit: 2015-02-21 | Discharge: 2015-02-21 | Disposition: A | Discharge status: Home / Self Care | Payer: Medicare Other | Attending: Emergency Medicine | Admitting: Emergency Medicine

## 2015-02-21 DIAGNOSIS — Z8673 Personal history of transient ischemic attack (TIA), and cerebral infarction without residual deficits: Secondary | ICD-10-CM | POA: Diagnosis not present

## 2015-02-21 DIAGNOSIS — Z794 Long term (current) use of insulin: Secondary | ICD-10-CM | POA: Insufficient documentation

## 2015-02-21 DIAGNOSIS — Z87891 Personal history of nicotine dependence: Secondary | ICD-10-CM | POA: Diagnosis not present

## 2015-02-21 DIAGNOSIS — S52134A Nondisplaced fracture of neck of right radius, initial encounter for closed fracture: Secondary | ICD-10-CM | POA: Diagnosis not present

## 2015-02-21 DIAGNOSIS — Z79899 Other long term (current) drug therapy: Secondary | ICD-10-CM | POA: Insufficient documentation

## 2015-02-21 DIAGNOSIS — S52121A Displaced fracture of head of right radius, initial encounter for closed fracture: Secondary | ICD-10-CM

## 2015-02-21 DIAGNOSIS — S4991XA Unspecified injury of right shoulder and upper arm, initial encounter: Secondary | ICD-10-CM | POA: Diagnosis present

## 2015-02-21 DIAGNOSIS — R079 Chest pain, unspecified: Secondary | ICD-10-CM | POA: Diagnosis not present

## 2015-02-21 DIAGNOSIS — Y9289 Other specified places as the place of occurrence of the external cause: Secondary | ICD-10-CM | POA: Insufficient documentation

## 2015-02-21 DIAGNOSIS — M79641 Pain in right hand: Secondary | ICD-10-CM | POA: Diagnosis not present

## 2015-02-21 DIAGNOSIS — Z7902 Long term (current) use of antithrombotics/antiplatelets: Secondary | ICD-10-CM | POA: Diagnosis not present

## 2015-02-21 DIAGNOSIS — E119 Type 2 diabetes mellitus without complications: Secondary | ICD-10-CM | POA: Insufficient documentation

## 2015-02-21 DIAGNOSIS — Y998 Other external cause status: Secondary | ICD-10-CM | POA: Diagnosis not present

## 2015-02-21 DIAGNOSIS — S59901A Unspecified injury of right elbow, initial encounter: Secondary | ICD-10-CM | POA: Insufficient documentation

## 2015-02-21 DIAGNOSIS — W010XXA Fall on same level from slipping, tripping and stumbling without subsequent striking against object, initial encounter: Secondary | ICD-10-CM | POA: Diagnosis not present

## 2015-02-21 DIAGNOSIS — S29001A Unspecified injury of muscle and tendon of front wall of thorax, initial encounter: Secondary | ICD-10-CM | POA: Insufficient documentation

## 2015-02-21 DIAGNOSIS — Y9389 Activity, other specified: Secondary | ICD-10-CM | POA: Insufficient documentation

## 2015-02-21 DIAGNOSIS — S52131A Displaced fracture of neck of right radius, initial encounter for closed fracture: Secondary | ICD-10-CM | POA: Diagnosis not present

## 2015-02-21 DIAGNOSIS — S6991XA Unspecified injury of right wrist, hand and finger(s), initial encounter: Secondary | ICD-10-CM | POA: Insufficient documentation

## 2015-02-21 MED ORDER — ACETAMINOPHEN 325 MG PO TABS
650.0000 mg | ORAL_TABLET | Freq: Once | ORAL | Status: AC
  Administered 2015-02-21: 650 mg via ORAL
  Filled 2015-02-21: qty 2

## 2015-02-21 NOTE — ED Notes (Signed)
Pt from [facility] c/o right arm and breast pain onset yesterday after tripping and falling. No head injury, no LOC. Pt alert and oriented x 4.
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
## 2015-02-22 DIAGNOSIS — S52124A Nondisplaced fracture of head of right radius, initial encounter for closed fracture: Secondary | ICD-10-CM | POA: Diagnosis not present

## 2015-02-22 DIAGNOSIS — I639 Cerebral infarction, unspecified: Secondary | ICD-10-CM | POA: Diagnosis not present

## 2015-02-25 DIAGNOSIS — I639 Cerebral infarction, unspecified: Secondary | ICD-10-CM | POA: Diagnosis not present

## 2015-02-25 DIAGNOSIS — S52124A Nondisplaced fracture of head of right radius, initial encounter for closed fracture: Secondary | ICD-10-CM | POA: Diagnosis not present

## 2015-02-26 DIAGNOSIS — S52124A Nondisplaced fracture of head of right radius, initial encounter for closed fracture: Secondary | ICD-10-CM | POA: Diagnosis not present

## 2015-02-26 DIAGNOSIS — I639 Cerebral infarction, unspecified: Secondary | ICD-10-CM | POA: Diagnosis not present

## 2015-02-27 DIAGNOSIS — M79641 Pain in right hand: Secondary | ICD-10-CM | POA: Diagnosis not present

## 2015-02-27 DIAGNOSIS — S62642A Nondisplaced fracture of proximal phalanx of right middle finger, initial encounter for closed fracture: Secondary | ICD-10-CM | POA: Diagnosis not present

## 2015-02-27 DIAGNOSIS — I639 Cerebral infarction, unspecified: Secondary | ICD-10-CM | POA: Diagnosis not present

## 2015-02-27 DIAGNOSIS — S52124A Nondisplaced fracture of head of right radius, initial encounter for closed fracture: Secondary | ICD-10-CM | POA: Diagnosis not present

## 2015-02-27 DIAGNOSIS — S52134A Nondisplaced fracture of neck of right radius, initial encounter for closed fracture: Secondary | ICD-10-CM | POA: Diagnosis not present

## 2015-03-03 DIAGNOSIS — S62642A Nondisplaced fracture of proximal phalanx of right middle finger, initial encounter for closed fracture: Secondary | ICD-10-CM | POA: Insufficient documentation

## 2015-03-03 DIAGNOSIS — S52134A Nondisplaced fracture of neck of right radius, initial encounter for closed fracture: Secondary | ICD-10-CM | POA: Insufficient documentation

## 2015-03-04 DIAGNOSIS — S52124A Nondisplaced fracture of head of right radius, initial encounter for closed fracture: Secondary | ICD-10-CM | POA: Diagnosis not present

## 2015-03-04 DIAGNOSIS — I639 Cerebral infarction, unspecified: Secondary | ICD-10-CM | POA: Diagnosis not present

## 2015-03-05 DIAGNOSIS — I639 Cerebral infarction, unspecified: Secondary | ICD-10-CM | POA: Diagnosis not present

## 2015-03-05 DIAGNOSIS — S52124A Nondisplaced fracture of head of right radius, initial encounter for closed fracture: Secondary | ICD-10-CM | POA: Diagnosis not present

## 2015-03-07 DIAGNOSIS — S52124A Nondisplaced fracture of head of right radius, initial encounter for closed fracture: Secondary | ICD-10-CM | POA: Diagnosis not present

## 2015-03-07 DIAGNOSIS — I639 Cerebral infarction, unspecified: Secondary | ICD-10-CM | POA: Diagnosis not present

## 2015-03-11 DIAGNOSIS — S52124A Nondisplaced fracture of head of right radius, initial encounter for closed fracture: Secondary | ICD-10-CM | POA: Diagnosis not present

## 2015-03-11 DIAGNOSIS — I639 Cerebral infarction, unspecified: Secondary | ICD-10-CM | POA: Diagnosis not present

## 2015-03-12 DIAGNOSIS — S52124A Nondisplaced fracture of head of right radius, initial encounter for closed fracture: Secondary | ICD-10-CM | POA: Diagnosis not present

## 2015-03-12 DIAGNOSIS — I639 Cerebral infarction, unspecified: Secondary | ICD-10-CM | POA: Diagnosis not present

## 2015-03-14 DIAGNOSIS — S52124A Nondisplaced fracture of head of right radius, initial encounter for closed fracture: Secondary | ICD-10-CM | POA: Diagnosis not present

## 2015-03-14 DIAGNOSIS — I639 Cerebral infarction, unspecified: Secondary | ICD-10-CM | POA: Diagnosis not present

## 2015-03-18 DIAGNOSIS — I639 Cerebral infarction, unspecified: Secondary | ICD-10-CM | POA: Diagnosis not present

## 2015-03-18 DIAGNOSIS — S52124A Nondisplaced fracture of head of right radius, initial encounter for closed fracture: Secondary | ICD-10-CM | POA: Diagnosis not present

## 2015-03-19 DIAGNOSIS — I639 Cerebral infarction, unspecified: Secondary | ICD-10-CM | POA: Diagnosis not present

## 2015-03-19 DIAGNOSIS — S52124A Nondisplaced fracture of head of right radius, initial encounter for closed fracture: Secondary | ICD-10-CM | POA: Diagnosis not present

## 2015-03-20 DIAGNOSIS — S52124D Nondisplaced fracture of head of right radius, subsequent encounter for closed fracture with routine healing: Secondary | ICD-10-CM | POA: Diagnosis not present

## 2015-03-20 DIAGNOSIS — M65341 Trigger finger, right ring finger: Secondary | ICD-10-CM | POA: Diagnosis not present

## 2015-03-20 DIAGNOSIS — S62642D Nondisplaced fracture of proximal phalanx of right middle finger, subsequent encounter for fracture with routine healing: Secondary | ICD-10-CM | POA: Diagnosis not present

## 2015-03-21 DIAGNOSIS — S52124A Nondisplaced fracture of head of right radius, initial encounter for closed fracture: Secondary | ICD-10-CM | POA: Diagnosis not present

## 2015-03-21 DIAGNOSIS — I639 Cerebral infarction, unspecified: Secondary | ICD-10-CM | POA: Diagnosis not present

## 2015-03-25 DIAGNOSIS — S52124A Nondisplaced fracture of head of right radius, initial encounter for closed fracture: Secondary | ICD-10-CM | POA: Diagnosis not present

## 2015-03-25 DIAGNOSIS — I639 Cerebral infarction, unspecified: Secondary | ICD-10-CM | POA: Diagnosis not present

## 2015-03-27 DIAGNOSIS — S52124A Nondisplaced fracture of head of right radius, initial encounter for closed fracture: Secondary | ICD-10-CM | POA: Diagnosis not present

## 2015-03-27 DIAGNOSIS — I639 Cerebral infarction, unspecified: Secondary | ICD-10-CM | POA: Diagnosis not present

## 2015-03-28 DIAGNOSIS — I639 Cerebral infarction, unspecified: Secondary | ICD-10-CM | POA: Diagnosis not present

## 2015-03-28 DIAGNOSIS — S52124A Nondisplaced fracture of head of right radius, initial encounter for closed fracture: Secondary | ICD-10-CM | POA: Diagnosis not present

## 2015-04-02 DIAGNOSIS — I639 Cerebral infarction, unspecified: Secondary | ICD-10-CM | POA: Diagnosis not present

## 2015-04-02 DIAGNOSIS — S52124A Nondisplaced fracture of head of right radius, initial encounter for closed fracture: Secondary | ICD-10-CM | POA: Diagnosis not present

## 2015-04-03 DIAGNOSIS — S52124A Nondisplaced fracture of head of right radius, initial encounter for closed fracture: Secondary | ICD-10-CM | POA: Diagnosis not present

## 2015-04-03 DIAGNOSIS — I639 Cerebral infarction, unspecified: Secondary | ICD-10-CM | POA: Diagnosis not present

## 2015-04-04 DIAGNOSIS — I639 Cerebral infarction, unspecified: Secondary | ICD-10-CM | POA: Diagnosis not present

## 2015-04-04 DIAGNOSIS — S52124A Nondisplaced fracture of head of right radius, initial encounter for closed fracture: Secondary | ICD-10-CM | POA: Diagnosis not present

## 2015-04-08 DIAGNOSIS — I639 Cerebral infarction, unspecified: Secondary | ICD-10-CM | POA: Diagnosis not present

## 2015-04-08 DIAGNOSIS — S52124A Nondisplaced fracture of head of right radius, initial encounter for closed fracture: Secondary | ICD-10-CM | POA: Diagnosis not present

## 2015-04-09 DIAGNOSIS — I639 Cerebral infarction, unspecified: Secondary | ICD-10-CM | POA: Diagnosis not present

## 2015-04-09 DIAGNOSIS — S52124A Nondisplaced fracture of head of right radius, initial encounter for closed fracture: Secondary | ICD-10-CM | POA: Diagnosis not present

## 2015-04-10 DIAGNOSIS — S62642D Nondisplaced fracture of proximal phalanx of right middle finger, subsequent encounter for fracture with routine healing: Secondary | ICD-10-CM | POA: Diagnosis not present

## 2015-04-10 DIAGNOSIS — S52124D Nondisplaced fracture of head of right radius, subsequent encounter for closed fracture with routine healing: Secondary | ICD-10-CM | POA: Diagnosis not present

## 2015-04-10 DIAGNOSIS — M65341 Trigger finger, right ring finger: Secondary | ICD-10-CM | POA: Diagnosis not present

## 2015-04-11 DIAGNOSIS — S52124A Nondisplaced fracture of head of right radius, initial encounter for closed fracture: Secondary | ICD-10-CM | POA: Diagnosis not present

## 2015-04-11 DIAGNOSIS — I639 Cerebral infarction, unspecified: Secondary | ICD-10-CM | POA: Diagnosis not present

## 2015-04-15 DIAGNOSIS — I639 Cerebral infarction, unspecified: Secondary | ICD-10-CM | POA: Diagnosis not present

## 2015-04-15 DIAGNOSIS — S52124A Nondisplaced fracture of head of right radius, initial encounter for closed fracture: Secondary | ICD-10-CM | POA: Diagnosis not present

## 2015-04-16 DIAGNOSIS — S52124A Nondisplaced fracture of head of right radius, initial encounter for closed fracture: Secondary | ICD-10-CM | POA: Diagnosis not present

## 2015-04-16 DIAGNOSIS — I639 Cerebral infarction, unspecified: Secondary | ICD-10-CM | POA: Diagnosis not present

## 2015-04-19 DIAGNOSIS — I639 Cerebral infarction, unspecified: Secondary | ICD-10-CM | POA: Diagnosis not present

## 2015-04-19 DIAGNOSIS — S52124A Nondisplaced fracture of head of right radius, initial encounter for closed fracture: Secondary | ICD-10-CM | POA: Diagnosis not present

## 2015-04-21 DIAGNOSIS — S52124A Nondisplaced fracture of head of right radius, initial encounter for closed fracture: Secondary | ICD-10-CM | POA: Diagnosis not present

## 2015-04-21 DIAGNOSIS — I639 Cerebral infarction, unspecified: Secondary | ICD-10-CM | POA: Diagnosis not present

## 2015-04-25 DIAGNOSIS — I639 Cerebral infarction, unspecified: Secondary | ICD-10-CM | POA: Diagnosis not present

## 2015-04-25 DIAGNOSIS — S52124A Nondisplaced fracture of head of right radius, initial encounter for closed fracture: Secondary | ICD-10-CM | POA: Diagnosis not present

## 2015-04-30 DIAGNOSIS — I639 Cerebral infarction, unspecified: Secondary | ICD-10-CM | POA: Diagnosis not present

## 2015-04-30 DIAGNOSIS — S52124A Nondisplaced fracture of head of right radius, initial encounter for closed fracture: Secondary | ICD-10-CM | POA: Diagnosis not present

## 2015-05-06 DIAGNOSIS — I639 Cerebral infarction, unspecified: Secondary | ICD-10-CM | POA: Diagnosis not present

## 2015-05-06 DIAGNOSIS — S52124A Nondisplaced fracture of head of right radius, initial encounter for closed fracture: Secondary | ICD-10-CM | POA: Diagnosis not present

## 2015-05-08 DIAGNOSIS — S52124D Nondisplaced fracture of head of right radius, subsequent encounter for closed fracture with routine healing: Secondary | ICD-10-CM | POA: Diagnosis not present

## 2015-05-08 DIAGNOSIS — M65341 Trigger finger, right ring finger: Secondary | ICD-10-CM | POA: Diagnosis not present

## 2015-05-08 DIAGNOSIS — S62642D Nondisplaced fracture of proximal phalanx of right middle finger, subsequent encounter for fracture with routine healing: Secondary | ICD-10-CM | POA: Diagnosis not present

## 2015-05-09 DIAGNOSIS — S52124A Nondisplaced fracture of head of right radius, initial encounter for closed fracture: Secondary | ICD-10-CM | POA: Diagnosis not present

## 2015-05-09 DIAGNOSIS — I639 Cerebral infarction, unspecified: Secondary | ICD-10-CM | POA: Diagnosis not present

## 2015-05-13 DIAGNOSIS — S52124A Nondisplaced fracture of head of right radius, initial encounter for closed fracture: Secondary | ICD-10-CM | POA: Diagnosis not present

## 2015-05-13 DIAGNOSIS — I639 Cerebral infarction, unspecified: Secondary | ICD-10-CM | POA: Diagnosis not present

## 2015-05-14 DIAGNOSIS — R32 Unspecified urinary incontinence: Secondary | ICD-10-CM | POA: Diagnosis not present

## 2015-05-14 DIAGNOSIS — Z6830 Body mass index (BMI) 30.0-30.9, adult: Secondary | ICD-10-CM | POA: Diagnosis not present

## 2015-05-14 DIAGNOSIS — Z1389 Encounter for screening for other disorder: Secondary | ICD-10-CM | POA: Diagnosis not present

## 2015-05-14 DIAGNOSIS — E1151 Type 2 diabetes mellitus with diabetic peripheral angiopathy without gangrene: Secondary | ICD-10-CM | POA: Diagnosis not present

## 2015-05-14 DIAGNOSIS — I638 Other cerebral infarction: Secondary | ICD-10-CM | POA: Diagnosis not present

## 2015-05-14 DIAGNOSIS — I10 Essential (primary) hypertension: Secondary | ICD-10-CM | POA: Diagnosis not present

## 2015-05-14 DIAGNOSIS — E784 Other hyperlipidemia: Secondary | ICD-10-CM | POA: Diagnosis not present

## 2015-05-16 DIAGNOSIS — I639 Cerebral infarction, unspecified: Secondary | ICD-10-CM | POA: Diagnosis not present

## 2015-05-16 DIAGNOSIS — S52124A Nondisplaced fracture of head of right radius, initial encounter for closed fracture: Secondary | ICD-10-CM | POA: Diagnosis not present

## 2015-05-20 DIAGNOSIS — I639 Cerebral infarction, unspecified: Secondary | ICD-10-CM | POA: Diagnosis not present

## 2015-05-20 DIAGNOSIS — S52124A Nondisplaced fracture of head of right radius, initial encounter for closed fracture: Secondary | ICD-10-CM | POA: Diagnosis not present

## 2015-05-22 DIAGNOSIS — S52124A Nondisplaced fracture of head of right radius, initial encounter for closed fracture: Secondary | ICD-10-CM | POA: Diagnosis not present

## 2015-05-22 DIAGNOSIS — I639 Cerebral infarction, unspecified: Secondary | ICD-10-CM | POA: Diagnosis not present

## 2015-05-28 DIAGNOSIS — I639 Cerebral infarction, unspecified: Secondary | ICD-10-CM | POA: Diagnosis not present

## 2015-05-28 DIAGNOSIS — S52124A Nondisplaced fracture of head of right radius, initial encounter for closed fracture: Secondary | ICD-10-CM | POA: Diagnosis not present

## 2015-05-30 DIAGNOSIS — Z0100 Encounter for examination of eyes and vision without abnormal findings: Secondary | ICD-10-CM | POA: Diagnosis not present

## 2015-05-30 DIAGNOSIS — H2513 Age-related nuclear cataract, bilateral: Secondary | ICD-10-CM | POA: Diagnosis not present

## 2015-05-30 DIAGNOSIS — H25013 Cortical age-related cataract, bilateral: Secondary | ICD-10-CM | POA: Diagnosis not present

## 2015-05-30 DIAGNOSIS — E119 Type 2 diabetes mellitus without complications: Secondary | ICD-10-CM | POA: Diagnosis not present

## 2015-05-31 DIAGNOSIS — I639 Cerebral infarction, unspecified: Secondary | ICD-10-CM | POA: Diagnosis not present

## 2015-05-31 DIAGNOSIS — S52124A Nondisplaced fracture of head of right radius, initial encounter for closed fracture: Secondary | ICD-10-CM | POA: Diagnosis not present

## 2015-06-03 DIAGNOSIS — I639 Cerebral infarction, unspecified: Secondary | ICD-10-CM | POA: Diagnosis not present

## 2015-06-03 DIAGNOSIS — S52124A Nondisplaced fracture of head of right radius, initial encounter for closed fracture: Secondary | ICD-10-CM | POA: Diagnosis not present

## 2015-06-06 DIAGNOSIS — S52124A Nondisplaced fracture of head of right radius, initial encounter for closed fracture: Secondary | ICD-10-CM | POA: Diagnosis not present

## 2015-06-06 DIAGNOSIS — I639 Cerebral infarction, unspecified: Secondary | ICD-10-CM | POA: Diagnosis not present

## 2015-09-03 DIAGNOSIS — Z6832 Body mass index (BMI) 32.0-32.9, adult: Secondary | ICD-10-CM | POA: Diagnosis not present

## 2015-09-03 DIAGNOSIS — J029 Acute pharyngitis, unspecified: Secondary | ICD-10-CM | POA: Diagnosis not present

## 2015-10-02 DIAGNOSIS — E784 Other hyperlipidemia: Secondary | ICD-10-CM | POA: Diagnosis not present

## 2015-10-02 DIAGNOSIS — R2689 Other abnormalities of gait and mobility: Secondary | ICD-10-CM | POA: Diagnosis not present

## 2015-10-02 DIAGNOSIS — Z6831 Body mass index (BMI) 31.0-31.9, adult: Secondary | ICD-10-CM | POA: Diagnosis not present

## 2015-10-02 DIAGNOSIS — I10 Essential (primary) hypertension: Secondary | ICD-10-CM | POA: Diagnosis not present

## 2015-10-02 DIAGNOSIS — Z23 Encounter for immunization: Secondary | ICD-10-CM | POA: Diagnosis not present

## 2015-10-02 DIAGNOSIS — E1151 Type 2 diabetes mellitus with diabetic peripheral angiopathy without gangrene: Secondary | ICD-10-CM | POA: Diagnosis not present

## 2015-10-02 DIAGNOSIS — F0390 Unspecified dementia without behavioral disturbance: Secondary | ICD-10-CM | POA: Diagnosis not present

## 2015-10-02 DIAGNOSIS — I638 Other cerebral infarction: Secondary | ICD-10-CM | POA: Diagnosis not present

## 2015-10-02 DIAGNOSIS — T148 Other injury of unspecified body region: Secondary | ICD-10-CM | POA: Diagnosis not present

## 2015-10-10 DIAGNOSIS — M810 Age-related osteoporosis without current pathological fracture: Secondary | ICD-10-CM | POA: Diagnosis not present

## 2015-10-14 DIAGNOSIS — Z23 Encounter for immunization: Secondary | ICD-10-CM | POA: Diagnosis not present

## 2015-10-25 DIAGNOSIS — R319 Hematuria, unspecified: Secondary | ICD-10-CM | POA: Diagnosis not present

## 2015-10-25 DIAGNOSIS — N390 Urinary tract infection, site not specified: Secondary | ICD-10-CM | POA: Diagnosis not present

## 2015-12-02 DIAGNOSIS — E1165 Type 2 diabetes mellitus with hyperglycemia: Secondary | ICD-10-CM | POA: Diagnosis not present

## 2015-12-02 DIAGNOSIS — Z6832 Body mass index (BMI) 32.0-32.9, adult: Secondary | ICD-10-CM | POA: Diagnosis not present

## 2015-12-02 DIAGNOSIS — R8290 Unspecified abnormal findings in urine: Secondary | ICD-10-CM | POA: Diagnosis not present

## 2015-12-02 DIAGNOSIS — R358 Other polyuria: Secondary | ICD-10-CM | POA: Diagnosis not present

## 2015-12-02 DIAGNOSIS — R32 Unspecified urinary incontinence: Secondary | ICD-10-CM | POA: Diagnosis not present

## 2015-12-02 DIAGNOSIS — E1151 Type 2 diabetes mellitus with diabetic peripheral angiopathy without gangrene: Secondary | ICD-10-CM | POA: Diagnosis not present

## 2015-12-24 DIAGNOSIS — I10 Essential (primary) hypertension: Secondary | ICD-10-CM | POA: Diagnosis not present

## 2015-12-24 DIAGNOSIS — J028 Acute pharyngitis due to other specified organisms: Secondary | ICD-10-CM | POA: Diagnosis not present

## 2015-12-24 DIAGNOSIS — Z6832 Body mass index (BMI) 32.0-32.9, adult: Secondary | ICD-10-CM | POA: Diagnosis not present

## 2015-12-24 DIAGNOSIS — F0390 Unspecified dementia without behavioral disturbance: Secondary | ICD-10-CM | POA: Diagnosis not present

## 2015-12-24 DIAGNOSIS — E1165 Type 2 diabetes mellitus with hyperglycemia: Secondary | ICD-10-CM | POA: Diagnosis not present

## 2015-12-24 DIAGNOSIS — R32 Unspecified urinary incontinence: Secondary | ICD-10-CM | POA: Diagnosis not present

## 2016-01-20 DIAGNOSIS — R05 Cough: Secondary | ICD-10-CM | POA: Diagnosis not present

## 2016-02-10 DIAGNOSIS — R351 Nocturia: Secondary | ICD-10-CM | POA: Diagnosis not present

## 2016-02-10 DIAGNOSIS — R350 Frequency of micturition: Secondary | ICD-10-CM | POA: Diagnosis not present

## 2016-02-10 DIAGNOSIS — N3944 Nocturnal enuresis: Secondary | ICD-10-CM | POA: Diagnosis not present

## 2016-02-10 DIAGNOSIS — N3941 Urge incontinence: Secondary | ICD-10-CM | POA: Diagnosis not present

## 2016-03-30 DIAGNOSIS — E1165 Type 2 diabetes mellitus with hyperglycemia: Secondary | ICD-10-CM | POA: Diagnosis not present

## 2016-03-30 DIAGNOSIS — Z6833 Body mass index (BMI) 33.0-33.9, adult: Secondary | ICD-10-CM | POA: Diagnosis not present

## 2016-03-30 DIAGNOSIS — E559 Vitamin D deficiency, unspecified: Secondary | ICD-10-CM | POA: Diagnosis not present

## 2016-03-30 DIAGNOSIS — F0390 Unspecified dementia without behavioral disturbance: Secondary | ICD-10-CM | POA: Diagnosis not present

## 2016-03-30 DIAGNOSIS — Z1389 Encounter for screening for other disorder: Secondary | ICD-10-CM | POA: Diagnosis not present

## 2016-03-30 DIAGNOSIS — M545 Low back pain: Secondary | ICD-10-CM | POA: Diagnosis not present

## 2016-03-30 DIAGNOSIS — N3281 Overactive bladder: Secondary | ICD-10-CM | POA: Diagnosis not present

## 2016-03-30 DIAGNOSIS — I5032 Chronic diastolic (congestive) heart failure: Secondary | ICD-10-CM | POA: Diagnosis not present

## 2016-03-30 DIAGNOSIS — I638 Other cerebral infarction: Secondary | ICD-10-CM | POA: Diagnosis not present

## 2016-03-30 DIAGNOSIS — I10 Essential (primary) hypertension: Secondary | ICD-10-CM | POA: Diagnosis not present

## 2016-04-13 DIAGNOSIS — N3944 Nocturnal enuresis: Secondary | ICD-10-CM | POA: Diagnosis not present

## 2016-04-13 DIAGNOSIS — N3941 Urge incontinence: Secondary | ICD-10-CM | POA: Diagnosis not present

## 2016-07-28 DIAGNOSIS — E1165 Type 2 diabetes mellitus with hyperglycemia: Secondary | ICD-10-CM | POA: Diagnosis not present

## 2016-07-28 DIAGNOSIS — I10 Essential (primary) hypertension: Secondary | ICD-10-CM | POA: Diagnosis not present

## 2016-07-28 DIAGNOSIS — I6350 Cerebral infarction due to unspecified occlusion or stenosis of unspecified cerebral artery: Secondary | ICD-10-CM | POA: Diagnosis not present

## 2016-07-28 DIAGNOSIS — R32 Unspecified urinary incontinence: Secondary | ICD-10-CM | POA: Diagnosis not present

## 2016-07-28 DIAGNOSIS — I5032 Chronic diastolic (congestive) heart failure: Secondary | ICD-10-CM | POA: Diagnosis not present

## 2016-07-28 DIAGNOSIS — N3281 Overactive bladder: Secondary | ICD-10-CM | POA: Diagnosis not present

## 2016-07-28 DIAGNOSIS — Z6834 Body mass index (BMI) 34.0-34.9, adult: Secondary | ICD-10-CM | POA: Diagnosis not present

## 2016-08-10 IMAGING — DX DG SHOULDER 2+V*R*
3 series · 3 of 3 positions shown · non-contrast
Comparison: None.

CLINICAL DATA: Fall.  Initial evaluation .

EXAM:
RIGHT SHOULDER - 2+ VIEW

[shoulder grashey]
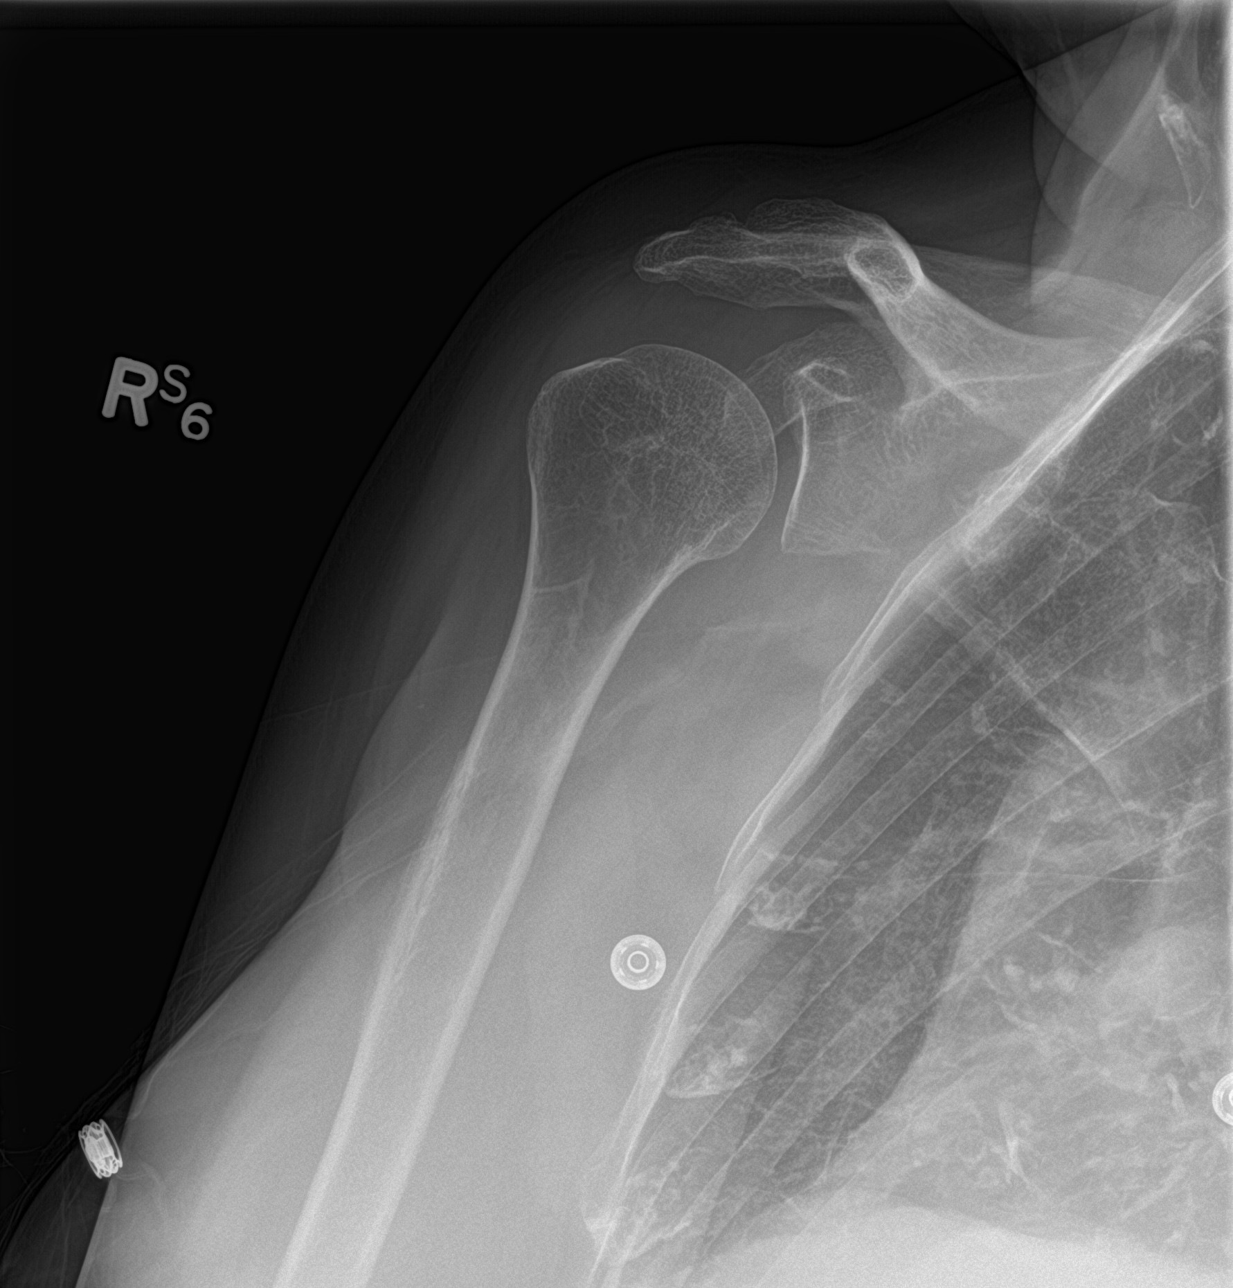

[shoulder y view]
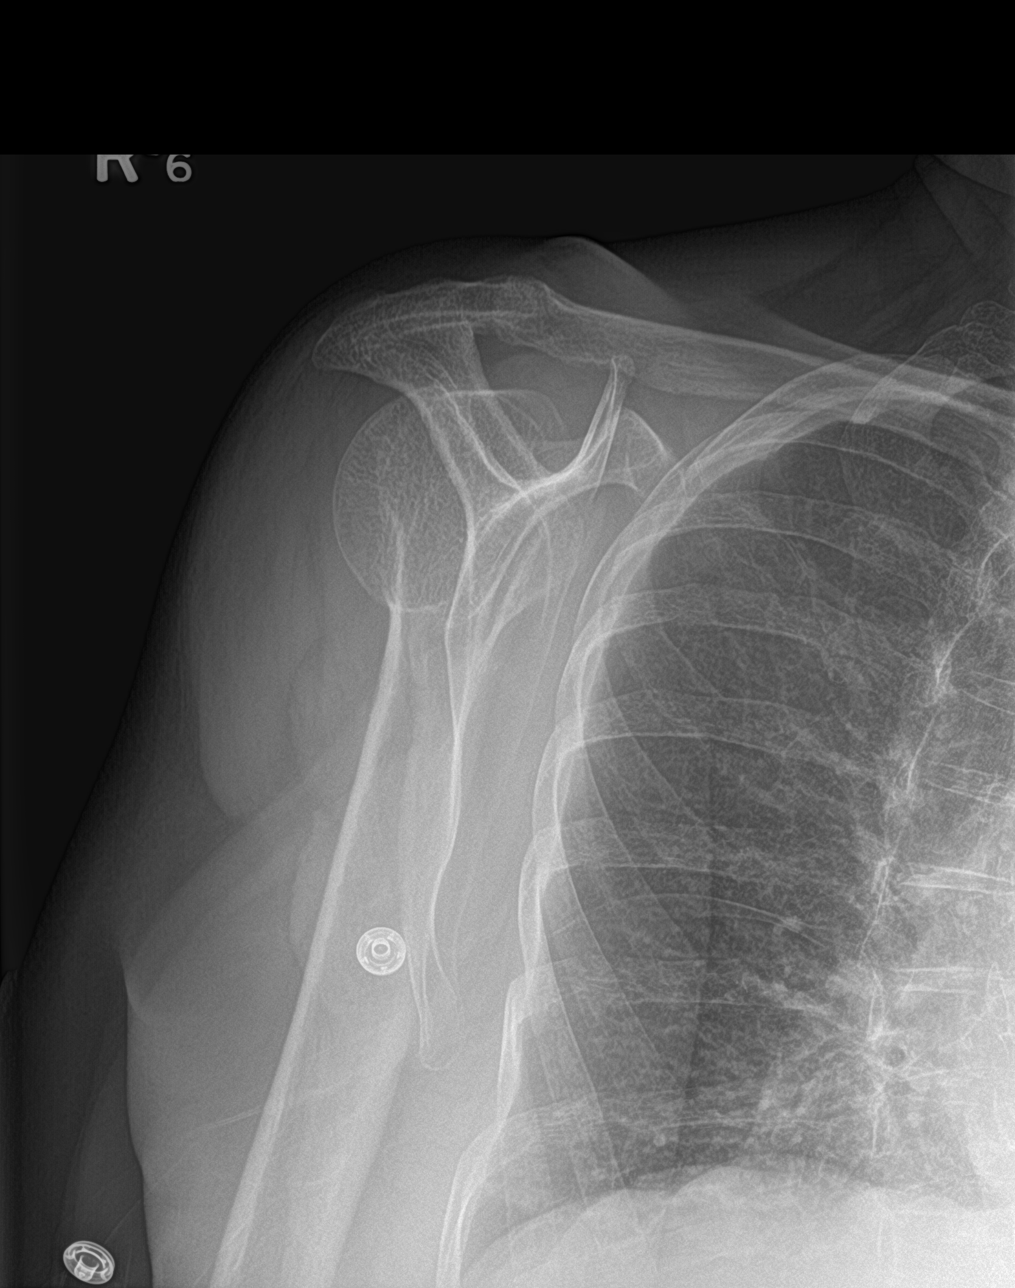

[shoulder axillary]
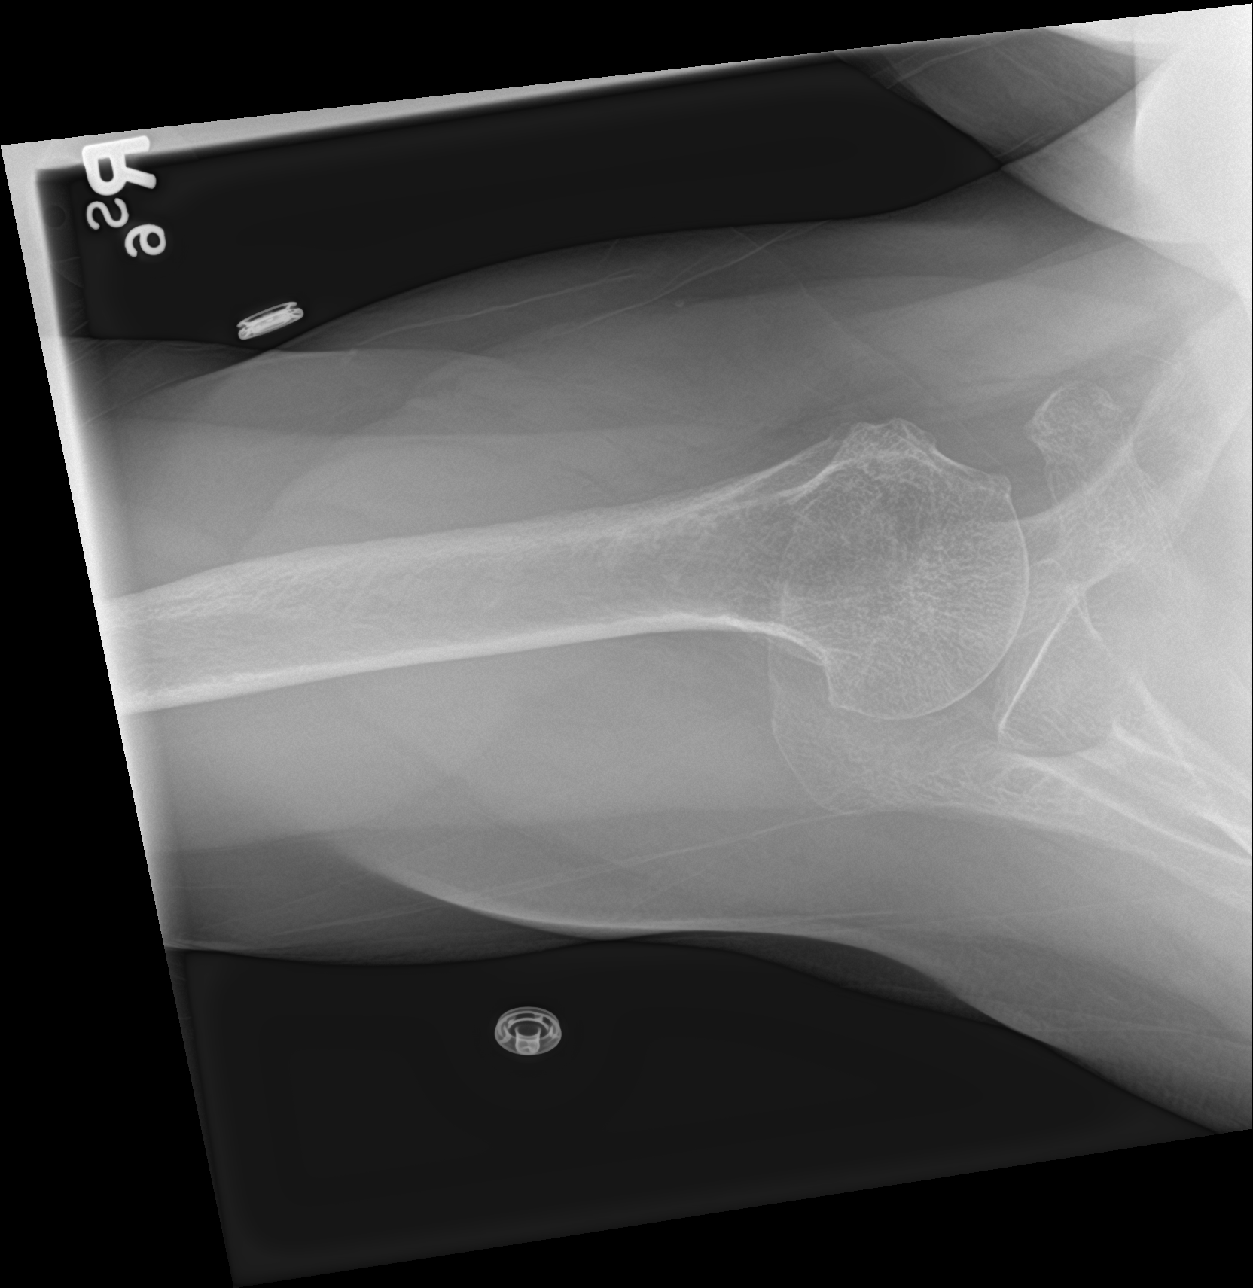

[3 of 3 positions shown; findings below may reference images not displayed]

FINDINGS: No acute bony or joint abnormality identified. No evidence of
fracture or dislocation. Diffuse osteopenia.
IMPRESSION: No acute abnormality.

## 2016-08-10 IMAGING — DX DG HAND COMPLETE 3+V*R*
3 series · 3 of 3 positions shown · non-contrast
Comparison: None.

CLINICAL DATA: Trip and fall yesterday with right hand pain,
initial encounter

EXAM:
RIGHT HAND - COMPLETE 3+ VIEW

[hand pa]
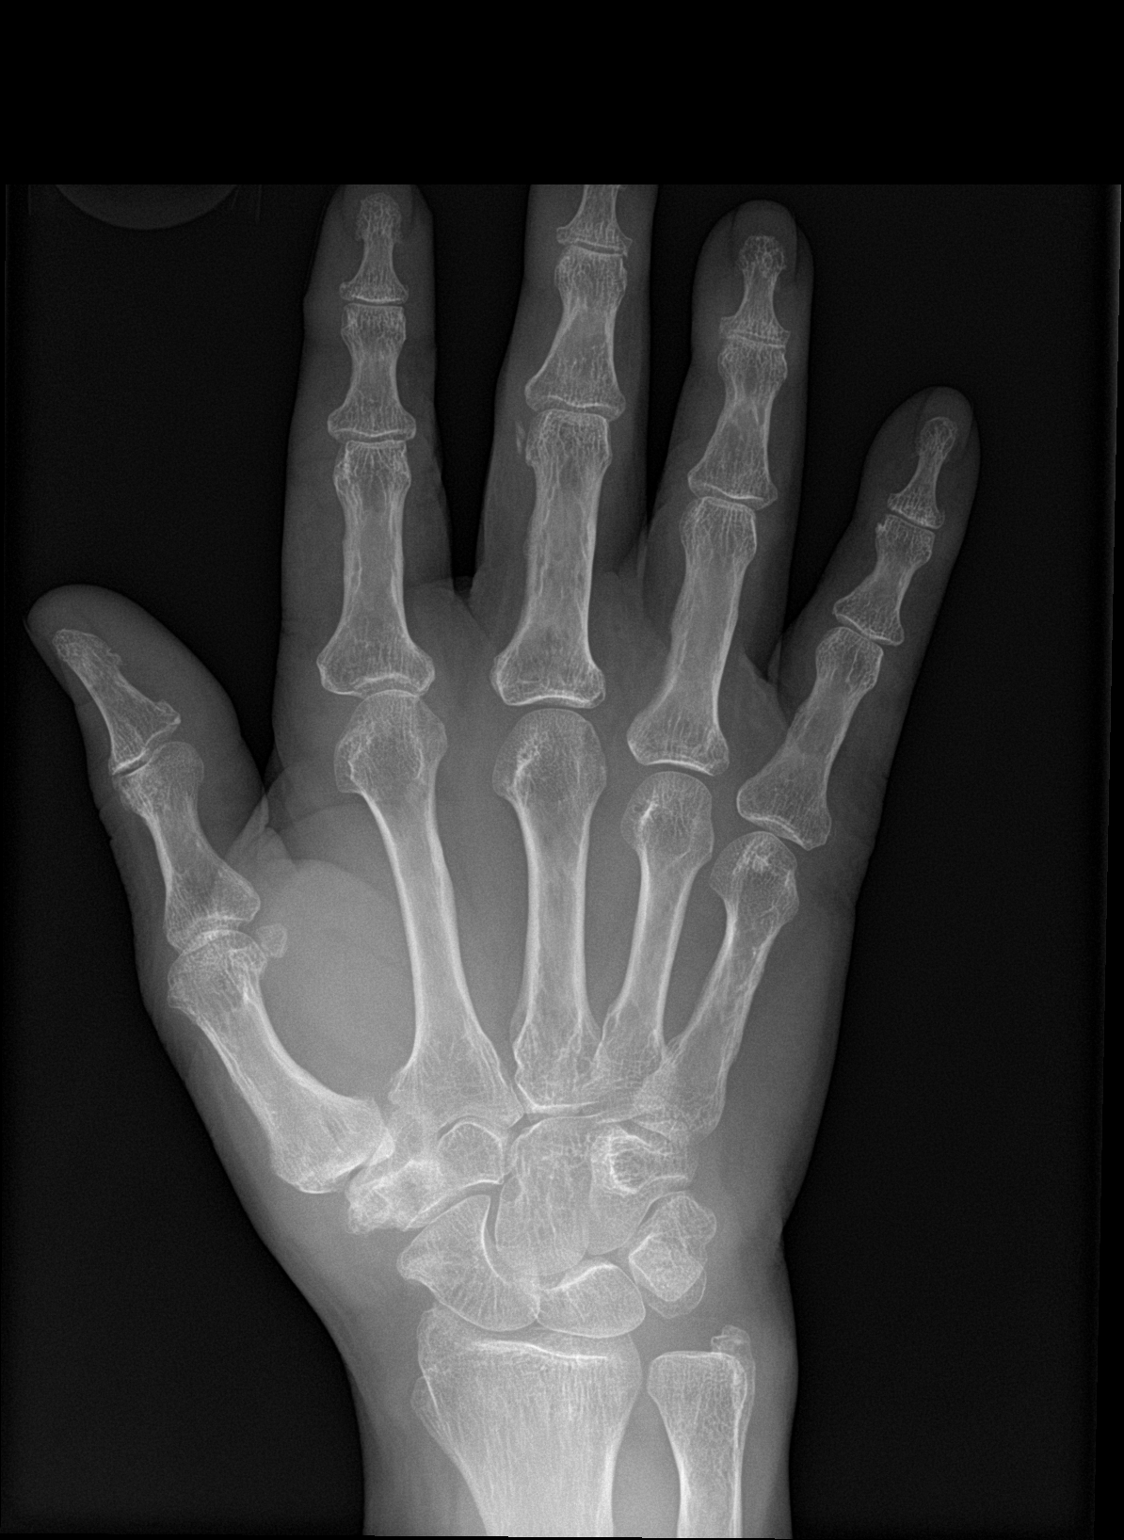

[hand obl]
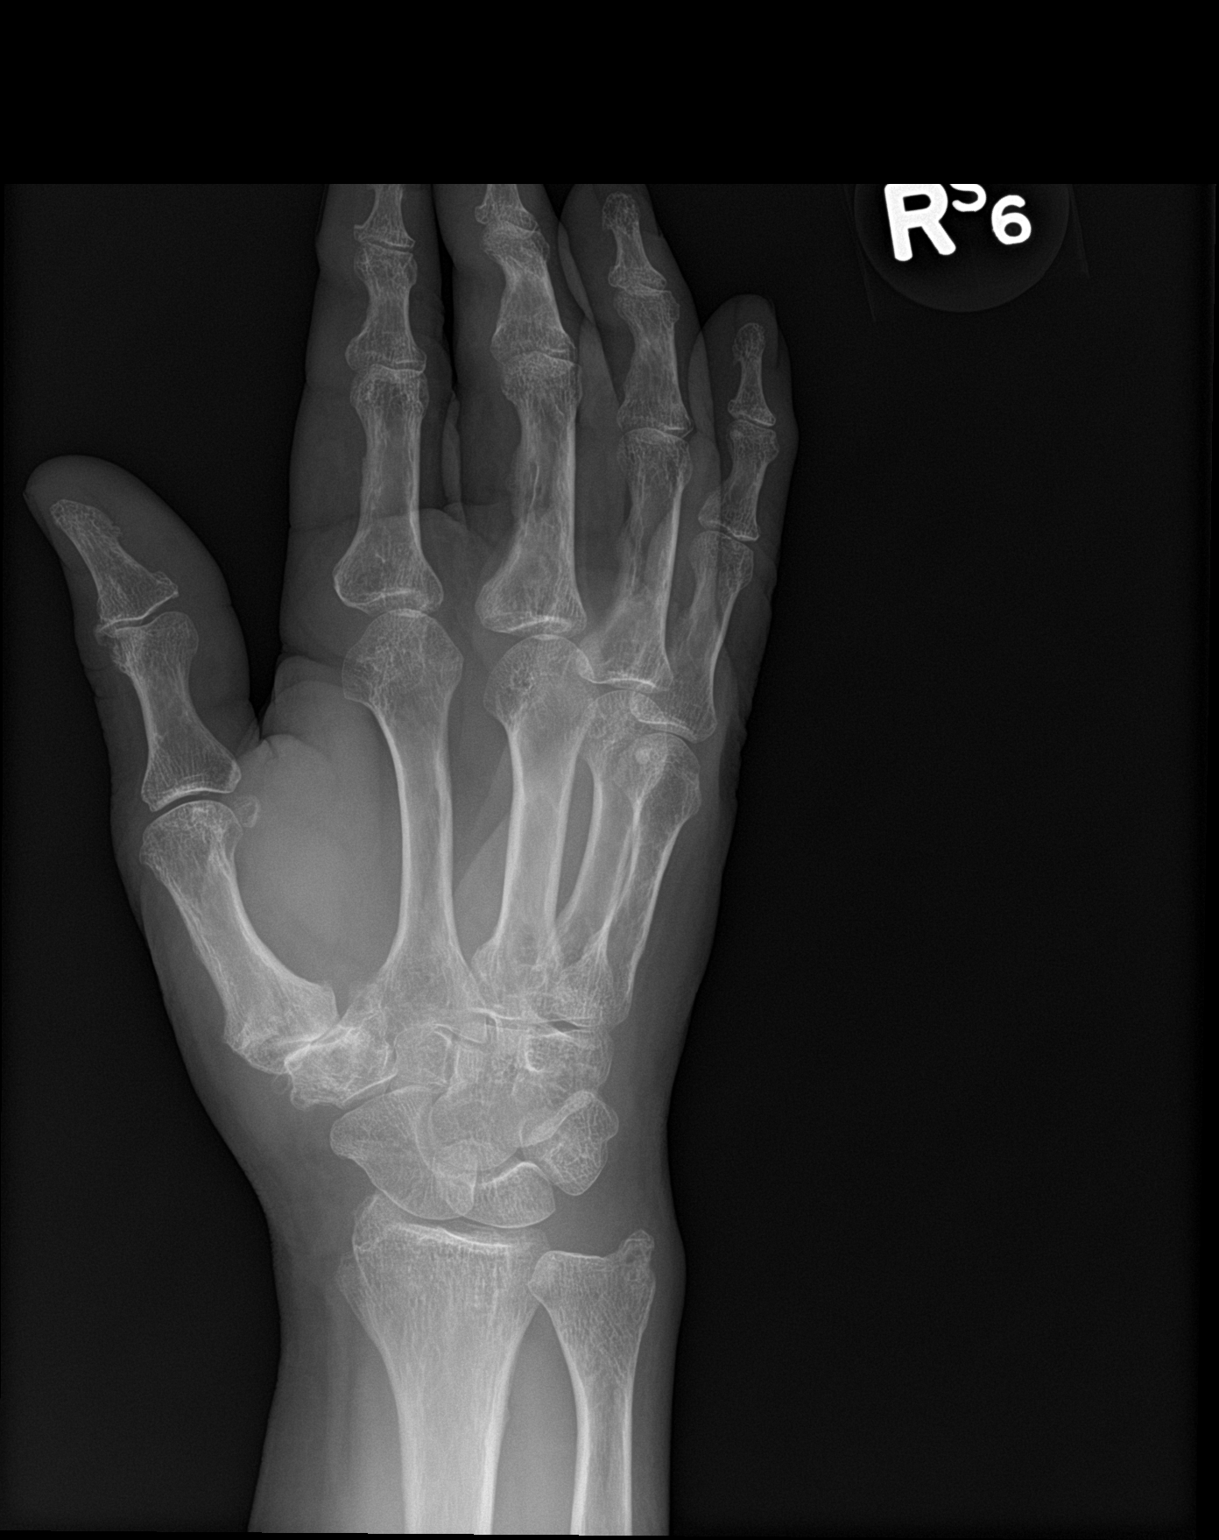

[hand lat]
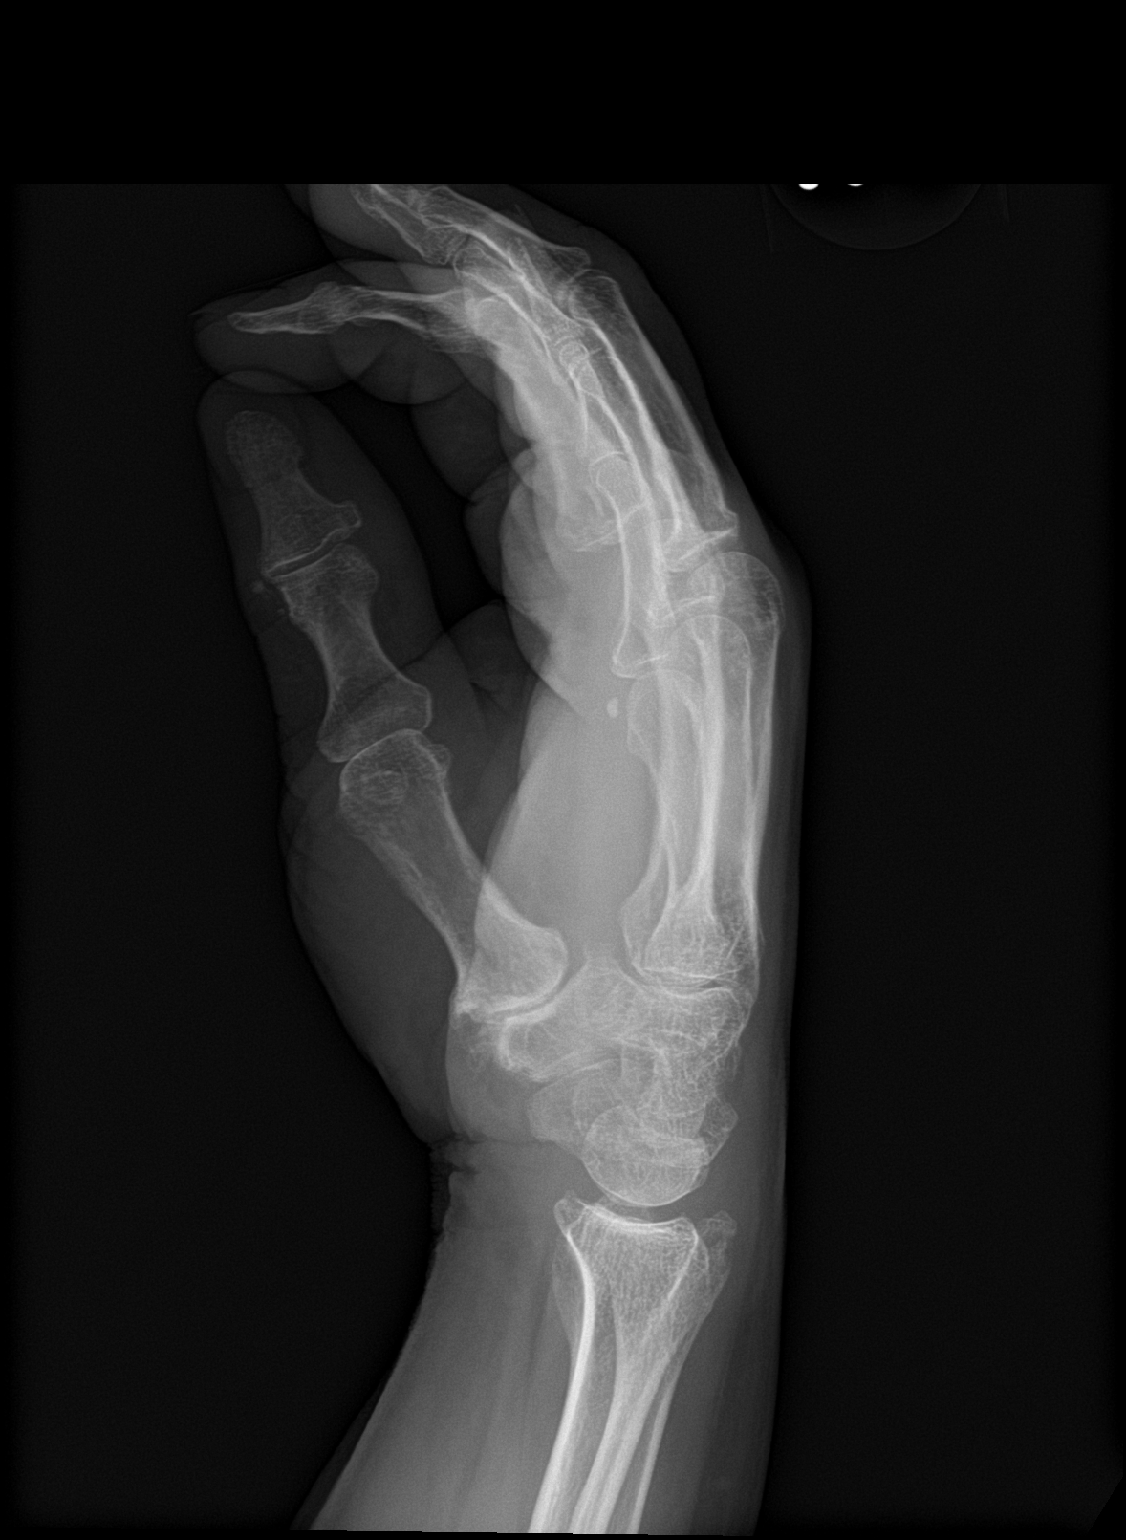

[3 of 3 positions shown; findings below may reference images not displayed]

FINDINGS: No acute fracture or dislocation is noted. Degenerative changes are
noted in the interphalangeal joints as well as at the first [facility]
joint. No acute soft tissue abnormality is noted.
IMPRESSION: Degenerative changes without acute abnormality.

## 2016-08-10 IMAGING — DX DG ELBOW COMPLETE 3+V*R*
4 series · 4 of 4 positions shown · non-contrast
Comparison: None.

CLINICAL DATA: Patient tripped [place] fell [place] the [place];
patient landed on right side; diffuse right lateral thorax pain;
diffuse right arm pain from shoulder to hand; limited mobility in
right arm

EXAM:
RIGHT ELBOW - COMPLETE 3+ VIEW

[elbow ap]
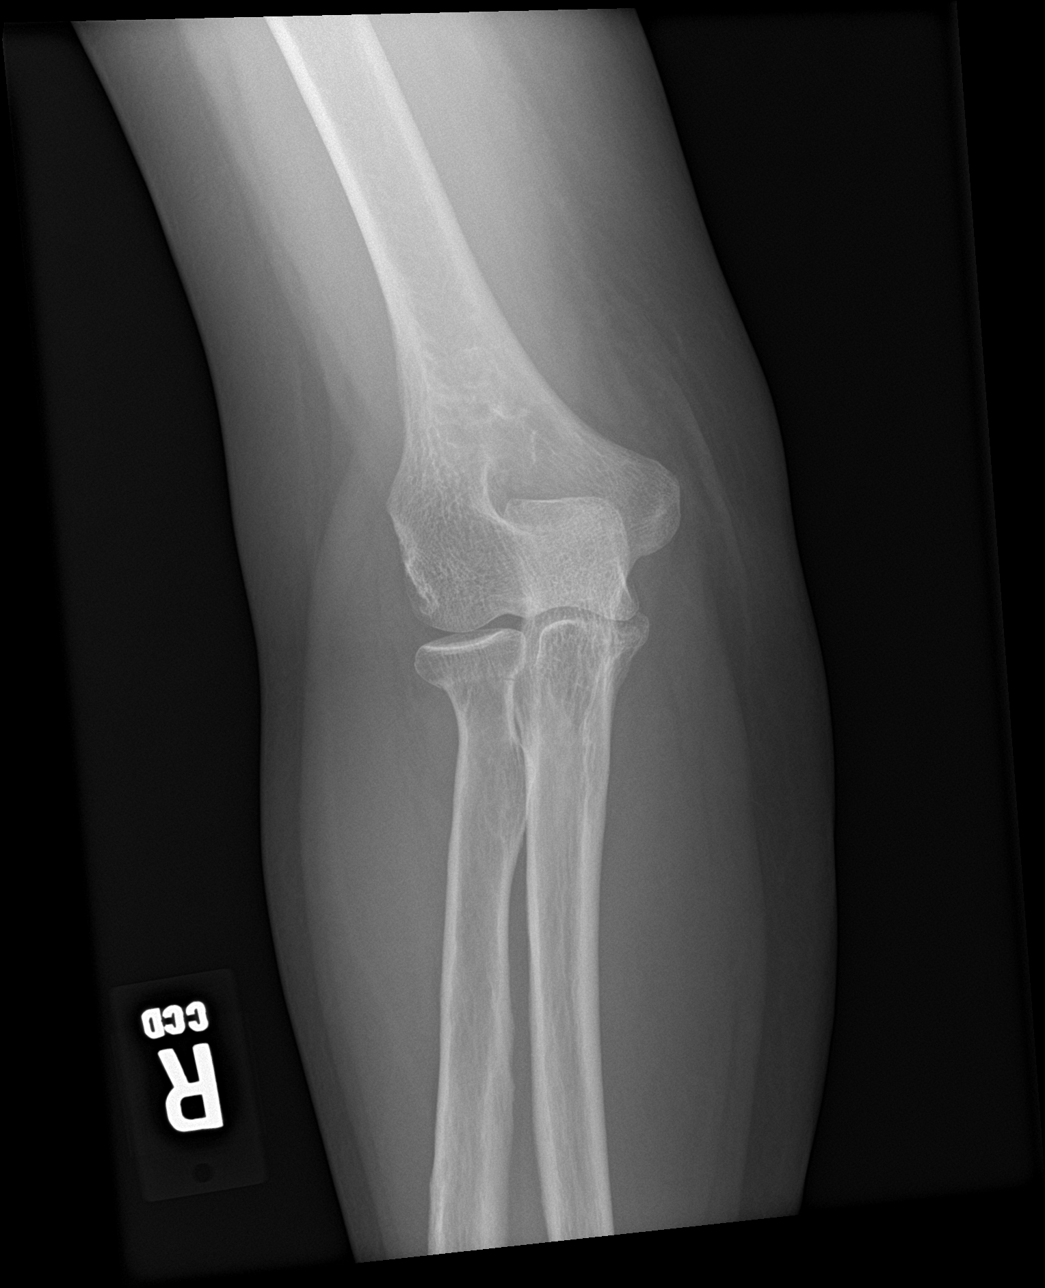

[elbow obl (1 of 2)]
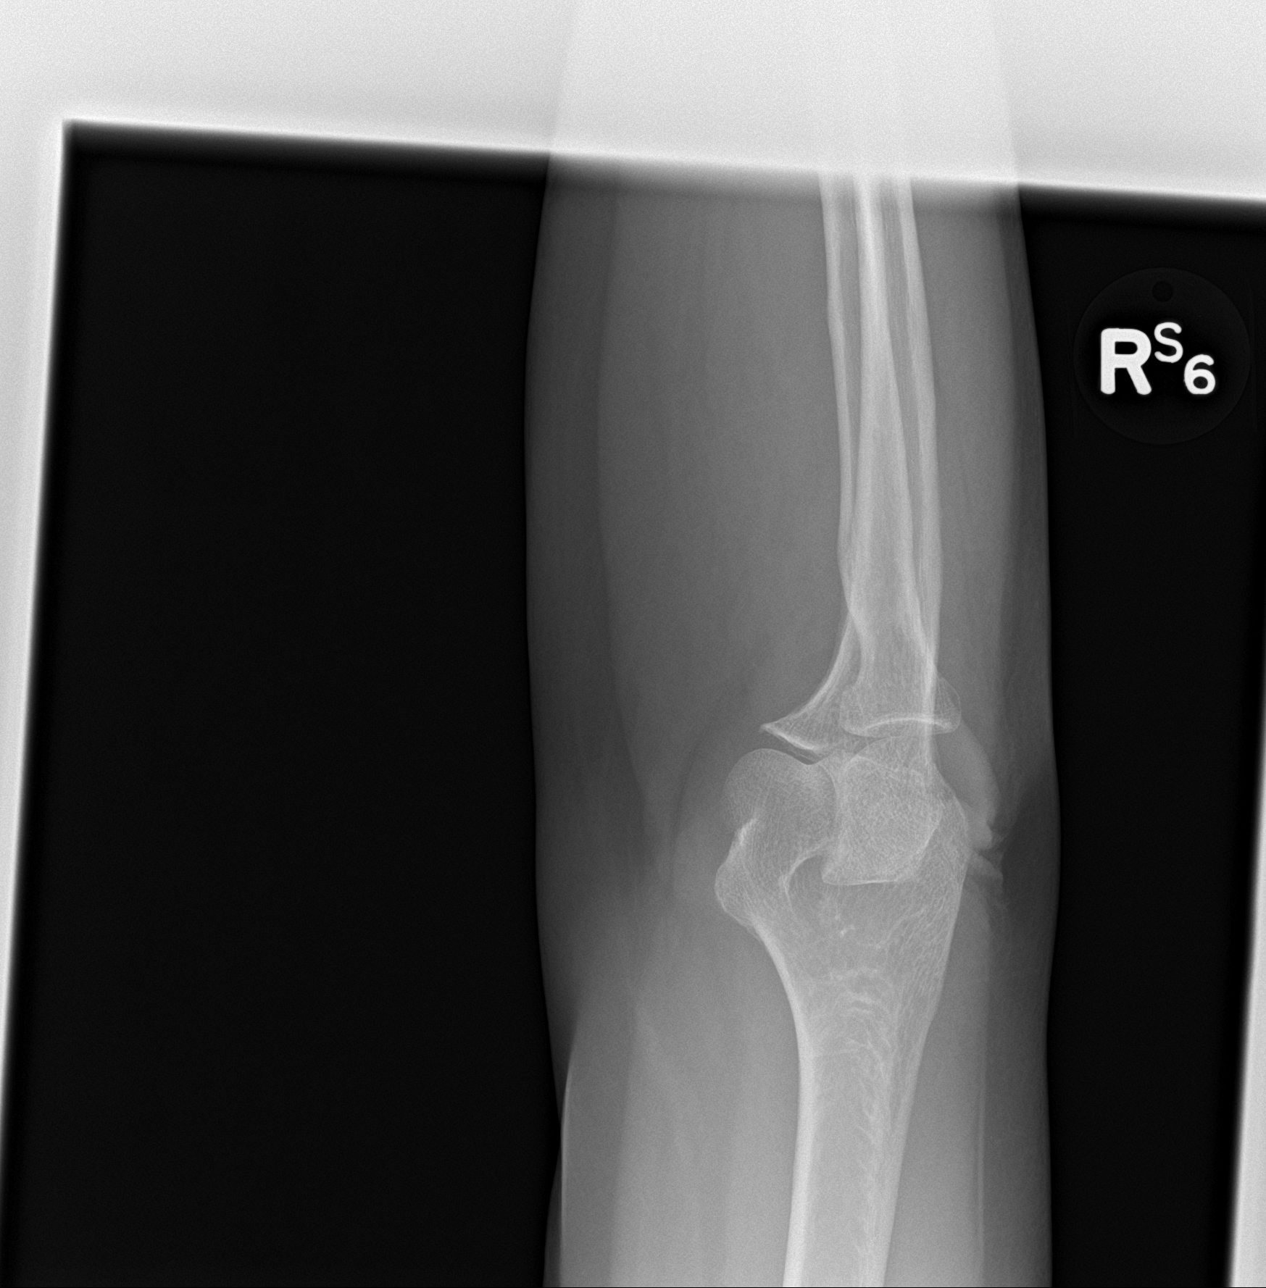

[elbow obl (2 of 2)]
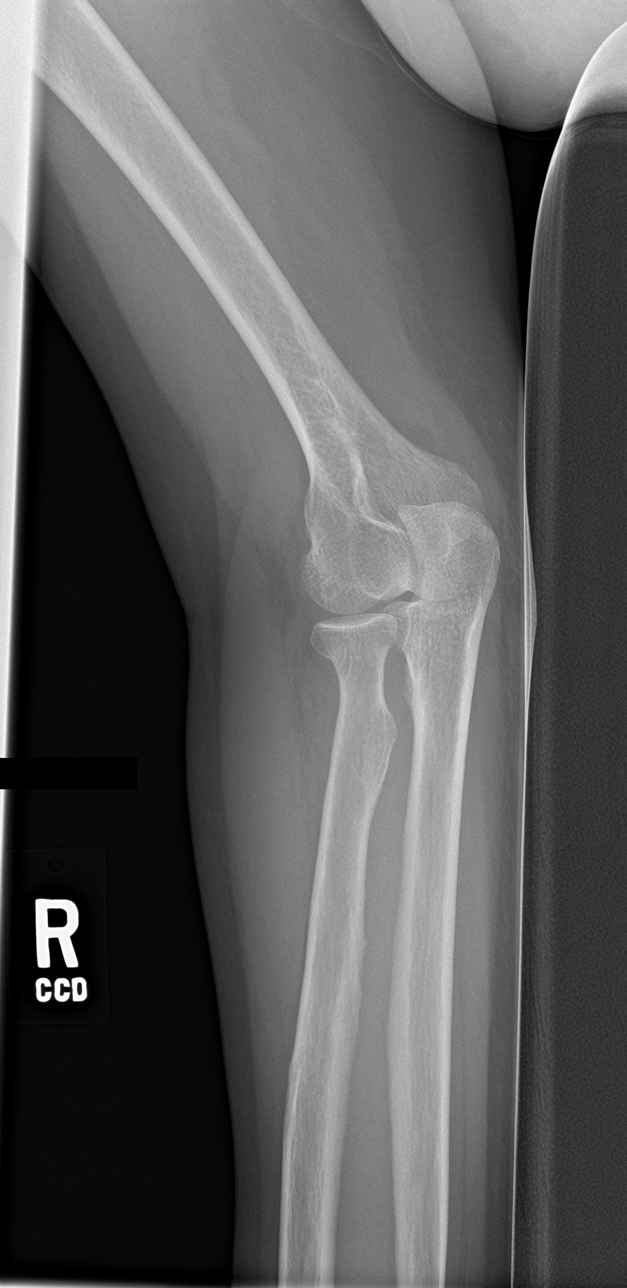

[elbow lat]
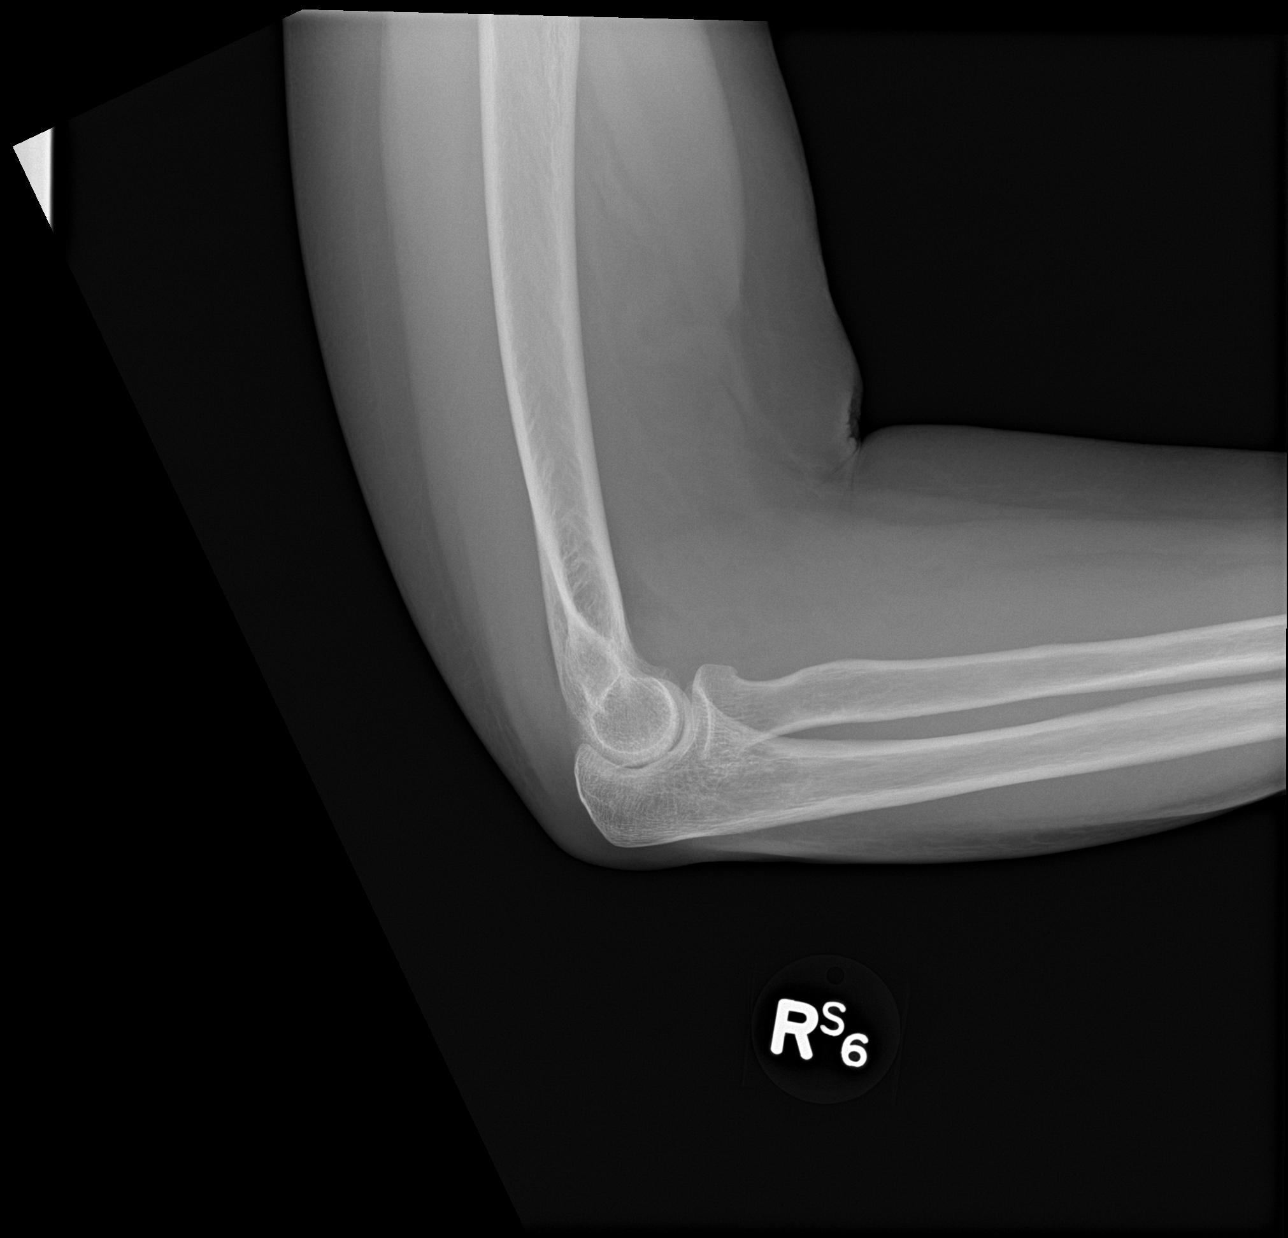

[4 of 4 positions shown; findings below may reference images not displayed]

FINDINGS: Low subtle, there is a transverse nondisplaced fracture across the
radial neck. There is no other evidence of a fracture. The joint is
normally spaced and aligned.

There is a joint effusion.  Soft tissues are unremarkable.
IMPRESSION: 1. Subtle nondisplaced fracture across the radial neck with an
associated joint effusion.

## 2016-08-13 DIAGNOSIS — N3942 Incontinence without sensory awareness: Secondary | ICD-10-CM | POA: Diagnosis not present

## 2016-08-13 DIAGNOSIS — N3941 Urge incontinence: Secondary | ICD-10-CM | POA: Diagnosis not present

## 2016-08-13 DIAGNOSIS — R351 Nocturia: Secondary | ICD-10-CM | POA: Diagnosis not present

## 2016-08-13 DIAGNOSIS — R350 Frequency of micturition: Secondary | ICD-10-CM | POA: Diagnosis not present

## 2016-08-13 DIAGNOSIS — N3944 Nocturnal enuresis: Secondary | ICD-10-CM | POA: Diagnosis not present

## 2016-09-24 DIAGNOSIS — N3941 Urge incontinence: Secondary | ICD-10-CM | POA: Diagnosis not present

## 2016-09-24 DIAGNOSIS — N3944 Nocturnal enuresis: Secondary | ICD-10-CM | POA: Diagnosis not present

## 2016-09-24 DIAGNOSIS — R351 Nocturia: Secondary | ICD-10-CM | POA: Diagnosis not present

## 2016-11-24 DIAGNOSIS — N3944 Nocturnal enuresis: Secondary | ICD-10-CM | POA: Diagnosis not present

## 2016-11-24 DIAGNOSIS — R351 Nocturia: Secondary | ICD-10-CM | POA: Diagnosis not present

## 2016-11-24 DIAGNOSIS — N3941 Urge incontinence: Secondary | ICD-10-CM | POA: Diagnosis not present

## 2016-11-24 DIAGNOSIS — N390 Urinary tract infection, site not specified: Secondary | ICD-10-CM | POA: Diagnosis not present

## 2017-02-11 DIAGNOSIS — I6389 Other cerebral infarction: Secondary | ICD-10-CM | POA: Diagnosis not present

## 2017-02-11 DIAGNOSIS — M6281 Muscle weakness (generalized): Secondary | ICD-10-CM | POA: Diagnosis not present

## 2017-02-11 DIAGNOSIS — I639 Cerebral infarction, unspecified: Secondary | ICD-10-CM | POA: Diagnosis not present

## 2017-02-15 DIAGNOSIS — I639 Cerebral infarction, unspecified: Secondary | ICD-10-CM | POA: Diagnosis not present

## 2017-02-15 DIAGNOSIS — M6281 Muscle weakness (generalized): Secondary | ICD-10-CM | POA: Diagnosis not present

## 2017-02-17 DIAGNOSIS — I639 Cerebral infarction, unspecified: Secondary | ICD-10-CM | POA: Diagnosis not present

## 2017-02-17 DIAGNOSIS — M6281 Muscle weakness (generalized): Secondary | ICD-10-CM | POA: Diagnosis not present

## 2017-02-18 DIAGNOSIS — M6281 Muscle weakness (generalized): Secondary | ICD-10-CM | POA: Diagnosis not present

## 2017-02-18 DIAGNOSIS — I639 Cerebral infarction, unspecified: Secondary | ICD-10-CM | POA: Diagnosis not present

## 2017-02-22 DIAGNOSIS — I639 Cerebral infarction, unspecified: Secondary | ICD-10-CM | POA: Diagnosis not present

## 2017-02-22 DIAGNOSIS — M6281 Muscle weakness (generalized): Secondary | ICD-10-CM | POA: Diagnosis not present

## 2017-02-26 DIAGNOSIS — M6281 Muscle weakness (generalized): Secondary | ICD-10-CM | POA: Diagnosis not present

## 2017-02-26 DIAGNOSIS — I639 Cerebral infarction, unspecified: Secondary | ICD-10-CM | POA: Diagnosis not present

## 2017-02-27 DIAGNOSIS — I639 Cerebral infarction, unspecified: Secondary | ICD-10-CM | POA: Diagnosis not present

## 2017-02-27 DIAGNOSIS — M6281 Muscle weakness (generalized): Secondary | ICD-10-CM | POA: Diagnosis not present

## 2017-03-01 DIAGNOSIS — I639 Cerebral infarction, unspecified: Secondary | ICD-10-CM | POA: Diagnosis not present

## 2017-03-01 DIAGNOSIS — M6281 Muscle weakness (generalized): Secondary | ICD-10-CM | POA: Diagnosis not present

## 2017-03-02 DIAGNOSIS — I639 Cerebral infarction, unspecified: Secondary | ICD-10-CM | POA: Diagnosis not present

## 2017-03-02 DIAGNOSIS — M6281 Muscle weakness (generalized): Secondary | ICD-10-CM | POA: Diagnosis not present

## 2017-03-03 DIAGNOSIS — I639 Cerebral infarction, unspecified: Secondary | ICD-10-CM | POA: Diagnosis not present

## 2017-03-03 DIAGNOSIS — M6281 Muscle weakness (generalized): Secondary | ICD-10-CM | POA: Diagnosis not present

## 2017-03-08 DIAGNOSIS — I639 Cerebral infarction, unspecified: Secondary | ICD-10-CM | POA: Diagnosis not present

## 2017-03-08 DIAGNOSIS — M6281 Muscle weakness (generalized): Secondary | ICD-10-CM | POA: Diagnosis not present

## 2017-03-10 DIAGNOSIS — M6281 Muscle weakness (generalized): Secondary | ICD-10-CM | POA: Diagnosis not present

## 2017-03-10 DIAGNOSIS — I639 Cerebral infarction, unspecified: Secondary | ICD-10-CM | POA: Diagnosis not present

## 2017-03-12 DIAGNOSIS — I639 Cerebral infarction, unspecified: Secondary | ICD-10-CM | POA: Diagnosis not present

## 2017-03-12 DIAGNOSIS — M6281 Muscle weakness (generalized): Secondary | ICD-10-CM | POA: Diagnosis not present

## 2017-03-15 DIAGNOSIS — I639 Cerebral infarction, unspecified: Secondary | ICD-10-CM | POA: Diagnosis not present

## 2017-03-15 DIAGNOSIS — M6281 Muscle weakness (generalized): Secondary | ICD-10-CM | POA: Diagnosis not present

## 2017-03-17 DIAGNOSIS — M6281 Muscle weakness (generalized): Secondary | ICD-10-CM | POA: Diagnosis not present

## 2017-03-17 DIAGNOSIS — I639 Cerebral infarction, unspecified: Secondary | ICD-10-CM | POA: Diagnosis not present

## 2017-03-18 DIAGNOSIS — M6281 Muscle weakness (generalized): Secondary | ICD-10-CM | POA: Diagnosis not present

## 2017-03-18 DIAGNOSIS — I639 Cerebral infarction, unspecified: Secondary | ICD-10-CM | POA: Diagnosis not present

## 2017-03-26 DIAGNOSIS — I639 Cerebral infarction, unspecified: Secondary | ICD-10-CM | POA: Diagnosis not present

## 2017-03-26 DIAGNOSIS — M6281 Muscle weakness (generalized): Secondary | ICD-10-CM | POA: Diagnosis not present

## 2017-03-29 DIAGNOSIS — M6281 Muscle weakness (generalized): Secondary | ICD-10-CM | POA: Diagnosis not present

## 2017-03-29 DIAGNOSIS — I639 Cerebral infarction, unspecified: Secondary | ICD-10-CM | POA: Diagnosis not present

## 2017-03-31 DIAGNOSIS — I639 Cerebral infarction, unspecified: Secondary | ICD-10-CM | POA: Diagnosis not present

## 2017-03-31 DIAGNOSIS — M6281 Muscle weakness (generalized): Secondary | ICD-10-CM | POA: Diagnosis not present

## 2017-04-01 DIAGNOSIS — I639 Cerebral infarction, unspecified: Secondary | ICD-10-CM | POA: Diagnosis not present

## 2017-04-01 DIAGNOSIS — M6281 Muscle weakness (generalized): Secondary | ICD-10-CM | POA: Diagnosis not present

## 2017-06-02 ENCOUNTER — Emergency Department (HOSPITAL_COMMUNITY)
Admission: EM | Admit: 2017-06-02 | Discharge: 2017-06-02 | Disposition: A | Discharge status: Home / Self Care | Payer: Medicare Other | Attending: Emergency Medicine | Admitting: Emergency Medicine

## 2017-06-02 ENCOUNTER — Encounter (HOSPITAL_COMMUNITY): Payer: Self-pay | Admitting: Emergency Medicine

## 2017-06-02 ENCOUNTER — Other Ambulatory Visit: Payer: Self-pay

## 2017-06-02 ENCOUNTER — Emergency Department (HOSPITAL_COMMUNITY): Payer: Medicare Other

## 2017-06-02 DIAGNOSIS — Z8673 Personal history of transient ischemic attack (TIA), and cerebral infarction without residual deficits: Secondary | ICD-10-CM | POA: Insufficient documentation

## 2017-06-02 DIAGNOSIS — Z87891 Personal history of nicotine dependence: Secondary | ICD-10-CM | POA: Diagnosis not present

## 2017-06-02 DIAGNOSIS — E119 Type 2 diabetes mellitus without complications: Secondary | ICD-10-CM | POA: Diagnosis not present

## 2017-06-02 DIAGNOSIS — W1830XA Fall on same level, unspecified, initial encounter: Secondary | ICD-10-CM | POA: Insufficient documentation

## 2017-06-02 DIAGNOSIS — I110 Hypertensive heart disease with heart failure: Secondary | ICD-10-CM | POA: Diagnosis not present

## 2017-06-02 DIAGNOSIS — I10 Essential (primary) hypertension: Secondary | ICD-10-CM | POA: Diagnosis not present

## 2017-06-02 DIAGNOSIS — D649 Anemia, unspecified: Secondary | ICD-10-CM | POA: Diagnosis not present

## 2017-06-02 DIAGNOSIS — I5032 Chronic diastolic (congestive) heart failure: Secondary | ICD-10-CM | POA: Diagnosis not present

## 2017-06-02 DIAGNOSIS — M25552 Pain in left hip: Secondary | ICD-10-CM | POA: Diagnosis not present

## 2017-06-02 DIAGNOSIS — Y939 Activity, unspecified: Secondary | ICD-10-CM | POA: Diagnosis not present

## 2017-06-02 DIAGNOSIS — Y92129 Unspecified place in nursing home as the place of occurrence of the external cause: Secondary | ICD-10-CM | POA: Insufficient documentation

## 2017-06-02 DIAGNOSIS — Z794 Long term (current) use of insulin: Secondary | ICD-10-CM | POA: Diagnosis not present

## 2017-06-02 DIAGNOSIS — Z79899 Other long term (current) drug therapy: Secondary | ICD-10-CM | POA: Insufficient documentation

## 2017-06-02 DIAGNOSIS — Z7902 Long term (current) use of antithrombotics/antiplatelets: Secondary | ICD-10-CM | POA: Insufficient documentation

## 2017-06-02 DIAGNOSIS — R1032 Left lower quadrant pain: Secondary | ICD-10-CM | POA: Diagnosis not present

## 2017-06-02 DIAGNOSIS — M2550 Pain in unspecified joint: Secondary | ICD-10-CM | POA: Diagnosis not present

## 2017-06-02 DIAGNOSIS — W19XXXA Unspecified fall, initial encounter: Secondary | ICD-10-CM

## 2017-06-02 DIAGNOSIS — T148XXA Other injury of unspecified body region, initial encounter: Secondary | ICD-10-CM | POA: Diagnosis not present

## 2017-06-02 DIAGNOSIS — Z7401 Bed confinement status: Secondary | ICD-10-CM | POA: Diagnosis not present

## 2017-06-02 DIAGNOSIS — Y999 Unspecified external cause status: Secondary | ICD-10-CM | POA: Insufficient documentation

## 2017-06-02 DIAGNOSIS — N289 Disorder of kidney and ureter, unspecified: Secondary | ICD-10-CM | POA: Diagnosis not present

## 2017-06-02 DIAGNOSIS — R402441 Other coma, without documented Glasgow coma scale score, or with partial score reported, in the field [EMT or ambulance]: Secondary | ICD-10-CM | POA: Diagnosis not present

## 2017-06-02 LAB — CBC WITH DIFFERENTIAL/PLATELET
Basophils Absolute: 0 10*3/uL (ref 0.0–0.1)
Basophils Relative: 0 %
EOS ABS: 0.1 10*3/uL (ref 0.0–0.7)
EOS PCT: 1 %
HEMATOCRIT: 34.2 % — AB (ref 36.0–46.0)
Hemoglobin: 10.9 g/dL — ABNORMAL LOW (ref 12.0–15.0)
LYMPHS ABS: 2.3 10*3/uL (ref 0.7–4.0)
Lymphocytes Relative: 28 %
MCH: 28.2 pg (ref 26.0–34.0)
MCHC: 31.9 g/dL (ref 30.0–36.0)
MCV: 88.6 fL (ref 78.0–100.0)
MONO ABS: 0.5 10*3/uL (ref 0.1–1.0)
MONOS PCT: 6 %
NEUTROS PCT: 65 %
Neutro Abs: 5.2 10*3/uL (ref 1.7–7.7)
PLATELETS: 300 10*3/uL (ref 150–400)
RBC: 3.86 MIL/uL — AB (ref 3.87–5.11)
RDW: 15.6 % — AB (ref 11.5–15.5)
WBC: 8.1 10*3/uL (ref 4.0–10.5)

## 2017-06-02 LAB — COMPREHENSIVE METABOLIC PANEL
ALK PHOS: 123 U/L (ref 38–126)
ALT: 17 U/L (ref 14–54)
AST: 24 U/L (ref 15–41)
Albumin: 3.7 g/dL (ref 3.5–5.0)
Anion gap: 10 (ref 5–15)
BUN: 19 mg/dL (ref 6–20)
CHLORIDE: 106 mmol/L (ref 101–111)
CO2: 23 mmol/L (ref 22–32)
CREATININE: 1.2 mg/dL — AB (ref 0.44–1.00)
Calcium: 9.3 mg/dL (ref 8.9–10.3)
GFR calc Af Amer: 49 mL/min — ABNORMAL LOW (ref >60)
GFR calc non Af Amer: 42 mL/min — ABNORMAL LOW (ref >60)
Glucose, Bld: 129 mg/dL — ABNORMAL HIGH (ref 65–99)
Potassium: 4.2 mmol/L (ref 3.5–5.1)
SODIUM: 139 mmol/L (ref 135–145)
Total Bilirubin: 0.2 mg/dL — ABNORMAL LOW (ref 0.3–1.2)
Total Protein: 7.1 g/dL (ref 6.5–8.1)

## 2017-06-02 MED ORDER — ACETAMINOPHEN 325 MG PO TABS
650.0000 mg | ORAL_TABLET | Freq: Once | ORAL | Status: AC
  Administered 2017-06-02: 650 mg via ORAL
  Filled 2017-06-02: qty 2

## 2017-06-02 NOTE — ED Notes (Signed)
Patient prefers an external female catheter to obtain urine.

## 2017-06-02 NOTE — ED Provider Notes (Addendum)
[facility] DEPT Provider Note   CSN: [id number] Arrival date & time: 06/02/17  1106     History   Chief Complaint Chief Complaint  Patient presents with  . Hip Pain  . Leg Swelling    HPI Audrey Carr is a 77 y.o. female.level V caveat dementia. History is obtained from Ms. Harrington, patient's guardian who accompanies her as well Sherri Rad, Chartered certified accountant at [facility] assisted-living facility. History from Ms Rolm Gala obtained by telephone  HPI Patient vomitedat 9 AM today. Shortly afterward she was found on the floor. Complaining of pain at left groin.She denies headache denies neck pain denies chest pain denies abdominal pain currently denies any nausea. There was no report that she had been ill prior to vomiting and her fall this morning. She denies any foot pain or ankle pain denies pain in extremities complains only of pain at left groin atleft inguinal crease.she normally walks unassisted Past Medical History:  Diagnosis Date  . Diabetes mellitus without complication ([place])   . Stroke [facility]) 10/12/2013    Patient Active Problem List   Diagnosis Date Noted  . Cerebral infarction due to thrombosis of right middle cerebral artery ([place]) 01/22/2014  . Essential hypertension 01/22/2014  . HLD (hyperlipidemia) 01/22/2014  . Type 2 diabetes mellitus with other circulatory complications ([place]) 16/96/7893  . Chronic diastolic heart failure ([place]) 10/16/2013  . CVA (cerebral vascular accident) ([place]) 10/12/2013  . Hypertension 10/12/2013  . Hyperlipidemia 10/12/2013  . Type 2 diabetes mellitus ([place]) 10/12/2013  . CVA (cerebral infarction) 10/12/2013    Past Surgical History:  Procedure Laterality Date  . ABDOMINAL HYSTERECTOMY       OB History   None      Home Medications    Prior to Admission medications   Medication Sig Start Date End Date Taking? Authorizing Provider  acetaminophen (TYLENOL) 500 MG tablet Take  500 mg by mouth every 6 (six) hours as needed for mild pain.   Yes [provider]  alendronate (FOSAMAX) 70 MG tablet Take 70 mg by mouth once a week. Take with a full glass of water on an empty stomach.   Yes [provider]  amLODipine (NORVASC) 5 MG tablet Take 5 mg by mouth daily.   Yes [provider]  atorvastatin (LIPITOR) 40 MG tablet Take 2 tablets (80 mg total) by mouth daily. Patient taking differently: Take 40 mg by mouth daily.  10/15/13  Yes Ghimire, Henreitta Leber, MD  buPROPion (WELLBUTRIN XL) 150 MG 24 hr tablet Take 150 mg by mouth daily.   Yes [provider]  Cholecalciferol (VITAMIN D-3) 5000 units TABS Take 5,000 Units by mouth daily.   Yes [provider]  clopidogrel (PLAVIX) 75 MG tablet Take 1 tablet (75 mg total) by mouth daily. 10/15/13  Yes Ghimire, Henreitta Leber, MD  donepezil (ARICEPT) 5 MG tablet Take 5 mg by mouth at bedtime.   Yes [provider]  escitalopram (LEXAPRO) 10 MG tablet Take 2 tablets (20 mg total) by mouth daily. 11/22/13  Yes Philmore Pali, NP  insulin aspart (NOVOLOG) 100 UNIT/ML injection 0-9 Units, Subcutaneous, 3 times daily with meals CBG < 70: implement hypoglycemia protocol CBG 70 - 120: 0 units CBG 121 - 150: 1 unit CBG 151 - 200: 2 units CBG 201 - 250: 3 units CBG 251 - 300: 5 units CBG 301 - 350: 7 units CBG 351 - 400: 9 units CBG > 400: call MD Patient taking  differently: Inject 10 Units into the skin 3 (three) times daily with meals.  10/15/13  Yes Ghimire, Henreitta Leber, MD  Insulin Glargine (BASAGLAR KWIKPEN) 100 UNIT/ML SOPN Inject 50 Units into the skin every morning.   Yes [provider]  trimethoprim (TRIMPEX) 100 MG tablet Take 100 mg by mouth daily.   Yes [provider]  ezetimibe (ZETIA) 10 MG tablet Take 1 tablet (10 mg total) by mouth daily. Patient not taking: Reported on 02/21/2015 02/09/14   Rosalin Hawking, MD    Family History Family History  Problem Relation Age  of Onset  . Stroke Mother     Social History Social History   Tobacco Use  . Smoking status: Former Smoker    Packs/day: 1.00    Types: Cigarettes  . Smokeless tobacco: Never Used  Substance Use Topics  . Alcohol use: No    Alcohol/week: 0.0 oz  . Drug use: No     Allergies   Patient has no known allergies.   Review of Systems Review of Systems  Unable to perform ROS: Dementia  Gastrointestinal: Positive for vomiting.  Musculoskeletal: Positive for arthralgias.     Physical Exam Updated Vital Signs There were no vitals taken for this visit.  Physical Exam  Constitutional: She appears well-developed and well-nourished. No distress.  HENT:  Head: Normocephalic and atraumatic.  Eyes: Pupils are equal, round, and reactive to light. Conjunctivae are normal.  Neck: Neck supple. No tracheal deviation present. No thyromegaly present.  Cardiovascular: Normal rate and regular rhythm.  No murmur heard. Pulmonary/Chest: Effort normal and breath sounds normal.  Abdominal: Soft. Bowel sounds are normal. She exhibits no distension. There is no tenderness.  obese  Musculoskeletal: Normal range of motion. She exhibits no edema or tenderness.  Entire spine nontender. Pelvis tender at left inguinal crease.. No redness or swelling. Bilateral lower extremities with 1+ pretibial pitting edema. There is no tenderness over feet or ankles. There is no pain on internal or external rotation of either thigh. Good capillary refill. Bilateral upper extremities no contusion abrasion or tenderness neurovascularly intact  Neurological: She is alert. Coordination normal.  ranial nerves II through XII grossly intact. Follow simple commands. Patient moves all extremity's. She is unable to flex left hip due to pain and groin.He is able to wiggle her toes well at left lower extremity and moves knee well. Motor strength 5 over 5 overall with the exception of left lower extremity likely start restricted  secondary to pain  Skin: Skin is warm and dry. Capillary refill takes less than 2 seconds. No rash noted.  Psychiatric: She has a normal mood and affect.  Nursing note and vitals reviewed.    ED Treatments / Results  Labs (all labs ordered are listed, but only abnormal results are displayed) Labs Reviewed  COMPREHENSIVE METABOLIC PANEL  CBC WITH DIFFERENTIAL/PLATELET  URINALYSIS, ROUTINE W REFLEX MICROSCOPIC    EKG None   EKG Interpretation  Date/Time:  Wednesday Jun 02 2017 11:55:20 EDT Ventricular Rate:  72 PR Interval:    QRS Duration: 96 QT Interval:  403 QTC Calculation: 441 R Axis:   25 Text Interpretation:  Sinus rhythm Borderline repolarization abnormality No significant change since last tracing Confirmed by Orlie Dakin [phone]) on 06/02/2017 12:48:03 PM      Results for orders placed or performed during the hospital encounter of 06/02/17  Comprehensive metabolic panel  Result Value Ref Range   Sodium 139 135 - 145 mmol/L   Potassium 4.2 3.5 -  5.1 mmol/L   Chloride 106 101 - 111 mmol/L   CO2 23 22 - 32 mmol/L   Glucose, Bld 129 (H) 65 - 99 mg/dL   BUN 19 6 - 20 mg/dL   Creatinine, Ser 1.20 (H) 0.44 - 1.00 mg/dL   Calcium 9.3 8.9 - 10.3 mg/dL   Total Protein 7.1 6.5 - 8.1 g/dL   Albumin 3.7 3.5 - 5.0 g/dL   AST 24 15 - 41 U/L   ALT 17 14 - 54 U/L   Alkaline Phosphatase 123 38 - 126 U/L   Total Bilirubin 0.2 (L) 0.3 - 1.2 mg/dL   GFR calc non Af Amer 42 (L) >60 mL/min   GFR calc Af Amer 49 (L) >60 mL/min   Anion gap 10 5 - 15  CBC with Differential/Platelet  Result Value Ref Range   WBC 8.1 4.0 - 10.5 K/uL   RBC 3.86 (L) 3.87 - 5.11 MIL/uL   Hemoglobin 10.9 (L) 12.0 - 15.0 g/dL   HCT 34.2 (L) 36.0 - 46.0 %   MCV 88.6 78.0 - 100.0 fL   MCH 28.2 26.0 - 34.0 pg   MCHC 31.9 30.0 - 36.0 g/dL   RDW 15.6 (H) 11.5 - 15.5 %   Platelets 300 150 - 400 K/uL   Neutrophils Relative % 65 %   Neutro Abs 5.2 1.7 - 7.7 K/uL   Lymphocytes Relative 28 %   Lymphs  Abs 2.3 0.7 - 4.0 K/uL   Monocytes Relative 6 %   Monocytes Absolute 0.5 0.1 - 1.0 K/uL   Eosinophils Relative 1 %   Eosinophils Absolute 0.1 0.0 - 0.7 K/uL   Basophils Relative 0 %   Basophils Absolute 0.0 0.0 - 0.1 K/uL   Ct Pelvis Wo Contrast  Result Date: 06/02/2017 CLINICAL DATA:  LEFT hip pain, possible fall today EXAM: CT PELVIS WITHOUT CONTRAST TECHNIQUE: Multidetector CT imaging of the pelvis was performed following the standard protocol without intravenous contrast. Sagittal and coronal MPR images reconstructed from axial data set. COMPARISON:  None FINDINGS: Urinary Tract:  Visualized LEFT ureter and bladder unremarkable Bowel:  Mild sigmoid diverticulosis Vascular/Lymphatic: Atherosclerotic calcifications in LEFT iliac and femoral arteries Reproductive:  N/A Other:  No abnormal fluid collection or mass Musculoskeletal: Diffuse osseous demineralization. Degenerative changes of the LEFT hip joint with mild joint space narrowing and minimal spur formation. No acute fracture, dislocation, or bone destruction. IMPRESSION: No acute osseous abnormalities. Sigmoid diverticulosis. Electronically Signed   By: Lavonia Dana M.D.   On: 06/02/2017 13:10   Radiology No results found.  Procedures Procedures (including critical care time)  Medications Ordered in ED Medications - No data to display   Initial Impression / Assessment and Plan / ED Course  I have reviewed the triage vital signs and the nursing notes.  Pertinent labs & imaging results that were available during my care of the patient were reviewed by me and considered in my medical decision making (see chart for details).     Tylenol ordered for pain. After Tylenol administered patient is able to flex at the left hip however she does complain of painat left groin when attempting to ambulate.  We have contacted the assisted living facility they are able to provide her with ample assistance. t 2:30 PM she is alert she complains  only of pain at left groin. She denies nausea. Denies other complaint Plan follow-up with PMD as needed. Tylenol for pain. Return to [facility] consistent with mild anemia and  renal insufficiency Final Clinical Impressions(s) / ED Diagnoses  Diagnosis #1 fall #2 contusion #3 vomiting Final diagnoses:  None  #4 anemia #5 renal insufficiency #6 elevated blood pressure ED Discharge Orders    None       Orlie Dakin, MD 06/02/17 1433    Orlie Dakin, MD 06/02/17 1433

## 2017-06-02 NOTE — ED Notes (Signed)
[facility] called  

## 2017-06-02 NOTE — ED Notes (Signed)
Britney from [facility] says they will accept the patient back and will assist with ambulation.

## 2017-06-02 NOTE — ED Notes (Signed)
Pt has been placed on the pure wick at this time. 

## 2017-06-02 NOTE — Discharge Instructions (Addendum)
Ms. Dickard may need assistance with walking. She may need a wheelchair for a few days. We did a CT scan of her pelvis however nothing broken. She can have Tylenol 650 mg every 4 hours as needed for pain. Blood pressure should be rechecked in a week. Today's was elevated at 150/116.Call her doctor or have her return if concern for any reason

## 2017-06-02 NOTE — ED Triage Notes (Addendum)
Pt has possible fall today. Left hip and foot pain and swelling. Patient AOx2, staff states that's not her normal but is on aricept currently for dementia. Vitals stable en route. Currently on Plavix.

## 2017-06-02 NOTE — ED Notes (Signed)
No urine output

## 2017-06-17 DIAGNOSIS — Z6833 Body mass index (BMI) 33.0-33.9, adult: Secondary | ICD-10-CM | POA: Diagnosis not present

## 2017-06-17 DIAGNOSIS — F0390 Unspecified dementia without behavioral disturbance: Secondary | ICD-10-CM | POA: Diagnosis not present

## 2017-06-17 DIAGNOSIS — I5032 Chronic diastolic (congestive) heart failure: Secondary | ICD-10-CM | POA: Diagnosis not present

## 2017-06-17 DIAGNOSIS — R410 Disorientation, unspecified: Secondary | ICD-10-CM | POA: Diagnosis not present

## 2017-06-17 DIAGNOSIS — F3289 Other specified depressive episodes: Secondary | ICD-10-CM | POA: Diagnosis not present

## 2017-06-17 DIAGNOSIS — R2689 Other abnormalities of gait and mobility: Secondary | ICD-10-CM | POA: Diagnosis not present

## 2017-06-17 DIAGNOSIS — I7389 Other specified peripheral vascular diseases: Secondary | ICD-10-CM | POA: Diagnosis not present

## 2017-06-17 DIAGNOSIS — E1165 Type 2 diabetes mellitus with hyperglycemia: Secondary | ICD-10-CM | POA: Diagnosis not present

## 2017-06-17 DIAGNOSIS — N3281 Overactive bladder: Secondary | ICD-10-CM | POA: Diagnosis not present

## 2017-06-17 DIAGNOSIS — I6350 Cerebral infarction due to unspecified occlusion or stenosis of unspecified cerebral artery: Secondary | ICD-10-CM | POA: Diagnosis not present

## 2017-06-17 DIAGNOSIS — E7849 Other hyperlipidemia: Secondary | ICD-10-CM | POA: Diagnosis not present

## 2017-06-17 DIAGNOSIS — I10 Essential (primary) hypertension: Secondary | ICD-10-CM | POA: Diagnosis not present

## 2017-06-19 DIAGNOSIS — Z794 Long term (current) use of insulin: Secondary | ICD-10-CM | POA: Diagnosis not present

## 2017-06-19 DIAGNOSIS — Z8673 Personal history of transient ischemic attack (TIA), and cerebral infarction without residual deficits: Secondary | ICD-10-CM | POA: Diagnosis not present

## 2017-06-19 DIAGNOSIS — E1151 Type 2 diabetes mellitus with diabetic peripheral angiopathy without gangrene: Secondary | ICD-10-CM | POA: Diagnosis not present

## 2017-06-19 DIAGNOSIS — F0390 Unspecified dementia without behavioral disturbance: Secondary | ICD-10-CM | POA: Diagnosis not present

## 2017-06-19 DIAGNOSIS — Z87891 Personal history of nicotine dependence: Secondary | ICD-10-CM | POA: Diagnosis not present

## 2017-06-19 DIAGNOSIS — Z7901 Long term (current) use of anticoagulants: Secondary | ICD-10-CM | POA: Diagnosis not present

## 2017-06-19 DIAGNOSIS — I5032 Chronic diastolic (congestive) heart failure: Secondary | ICD-10-CM | POA: Diagnosis not present

## 2017-06-19 DIAGNOSIS — I110 Hypertensive heart disease with heart failure: Secondary | ICD-10-CM | POA: Diagnosis not present

## 2017-06-19 DIAGNOSIS — Z9181 History of falling: Secondary | ICD-10-CM | POA: Diagnosis not present

## 2017-06-21 DIAGNOSIS — I5032 Chronic diastolic (congestive) heart failure: Secondary | ICD-10-CM | POA: Diagnosis not present

## 2017-06-21 DIAGNOSIS — Z7901 Long term (current) use of anticoagulants: Secondary | ICD-10-CM | POA: Diagnosis not present

## 2017-06-21 DIAGNOSIS — Z794 Long term (current) use of insulin: Secondary | ICD-10-CM | POA: Diagnosis not present

## 2017-06-21 DIAGNOSIS — I110 Hypertensive heart disease with heart failure: Secondary | ICD-10-CM | POA: Diagnosis not present

## 2017-06-21 DIAGNOSIS — F0390 Unspecified dementia without behavioral disturbance: Secondary | ICD-10-CM | POA: Diagnosis not present

## 2017-06-21 DIAGNOSIS — E1151 Type 2 diabetes mellitus with diabetic peripheral angiopathy without gangrene: Secondary | ICD-10-CM | POA: Diagnosis not present

## 2017-06-22 DIAGNOSIS — I110 Hypertensive heart disease with heart failure: Secondary | ICD-10-CM | POA: Diagnosis not present

## 2017-06-22 DIAGNOSIS — E1151 Type 2 diabetes mellitus with diabetic peripheral angiopathy without gangrene: Secondary | ICD-10-CM | POA: Diagnosis not present

## 2017-06-22 DIAGNOSIS — Z794 Long term (current) use of insulin: Secondary | ICD-10-CM | POA: Diagnosis not present

## 2017-06-22 DIAGNOSIS — F0390 Unspecified dementia without behavioral disturbance: Secondary | ICD-10-CM | POA: Diagnosis not present

## 2017-06-22 DIAGNOSIS — Z7901 Long term (current) use of anticoagulants: Secondary | ICD-10-CM | POA: Diagnosis not present

## 2017-06-22 DIAGNOSIS — I5032 Chronic diastolic (congestive) heart failure: Secondary | ICD-10-CM | POA: Diagnosis not present

## 2017-06-23 DIAGNOSIS — E1151 Type 2 diabetes mellitus with diabetic peripheral angiopathy without gangrene: Secondary | ICD-10-CM | POA: Diagnosis not present

## 2017-06-23 DIAGNOSIS — F0390 Unspecified dementia without behavioral disturbance: Secondary | ICD-10-CM | POA: Diagnosis not present

## 2017-06-23 DIAGNOSIS — Z7901 Long term (current) use of anticoagulants: Secondary | ICD-10-CM | POA: Diagnosis not present

## 2017-06-23 DIAGNOSIS — I110 Hypertensive heart disease with heart failure: Secondary | ICD-10-CM | POA: Diagnosis not present

## 2017-06-23 DIAGNOSIS — I5032 Chronic diastolic (congestive) heart failure: Secondary | ICD-10-CM | POA: Diagnosis not present

## 2017-06-23 DIAGNOSIS — Z794 Long term (current) use of insulin: Secondary | ICD-10-CM | POA: Diagnosis not present

## 2017-06-24 DIAGNOSIS — Z7901 Long term (current) use of anticoagulants: Secondary | ICD-10-CM | POA: Diagnosis not present

## 2017-06-24 DIAGNOSIS — F0390 Unspecified dementia without behavioral disturbance: Secondary | ICD-10-CM | POA: Diagnosis not present

## 2017-06-24 DIAGNOSIS — E1151 Type 2 diabetes mellitus with diabetic peripheral angiopathy without gangrene: Secondary | ICD-10-CM | POA: Diagnosis not present

## 2017-06-24 DIAGNOSIS — Z794 Long term (current) use of insulin: Secondary | ICD-10-CM | POA: Diagnosis not present

## 2017-06-24 DIAGNOSIS — I110 Hypertensive heart disease with heart failure: Secondary | ICD-10-CM | POA: Diagnosis not present

## 2017-06-24 DIAGNOSIS — I5032 Chronic diastolic (congestive) heart failure: Secondary | ICD-10-CM | POA: Diagnosis not present

## 2017-06-28 DIAGNOSIS — I5032 Chronic diastolic (congestive) heart failure: Secondary | ICD-10-CM | POA: Diagnosis not present

## 2017-06-28 DIAGNOSIS — E1151 Type 2 diabetes mellitus with diabetic peripheral angiopathy without gangrene: Secondary | ICD-10-CM | POA: Diagnosis not present

## 2017-06-28 DIAGNOSIS — I110 Hypertensive heart disease with heart failure: Secondary | ICD-10-CM | POA: Diagnosis not present

## 2017-06-28 DIAGNOSIS — Z794 Long term (current) use of insulin: Secondary | ICD-10-CM | POA: Diagnosis not present

## 2017-06-28 DIAGNOSIS — F0390 Unspecified dementia without behavioral disturbance: Secondary | ICD-10-CM | POA: Diagnosis not present

## 2017-06-28 DIAGNOSIS — Z7901 Long term (current) use of anticoagulants: Secondary | ICD-10-CM | POA: Diagnosis not present

## 2017-06-29 DIAGNOSIS — I110 Hypertensive heart disease with heart failure: Secondary | ICD-10-CM | POA: Diagnosis not present

## 2017-06-29 DIAGNOSIS — E1151 Type 2 diabetes mellitus with diabetic peripheral angiopathy without gangrene: Secondary | ICD-10-CM | POA: Diagnosis not present

## 2017-06-29 DIAGNOSIS — I5032 Chronic diastolic (congestive) heart failure: Secondary | ICD-10-CM | POA: Diagnosis not present

## 2017-06-29 DIAGNOSIS — Z794 Long term (current) use of insulin: Secondary | ICD-10-CM | POA: Diagnosis not present

## 2017-06-29 DIAGNOSIS — Z7901 Long term (current) use of anticoagulants: Secondary | ICD-10-CM | POA: Diagnosis not present

## 2017-06-29 DIAGNOSIS — F0390 Unspecified dementia without behavioral disturbance: Secondary | ICD-10-CM | POA: Diagnosis not present

## 2017-07-01 DIAGNOSIS — E1151 Type 2 diabetes mellitus with diabetic peripheral angiopathy without gangrene: Secondary | ICD-10-CM | POA: Diagnosis not present

## 2017-07-01 DIAGNOSIS — I110 Hypertensive heart disease with heart failure: Secondary | ICD-10-CM | POA: Diagnosis not present

## 2017-07-01 DIAGNOSIS — Z794 Long term (current) use of insulin: Secondary | ICD-10-CM | POA: Diagnosis not present

## 2017-07-01 DIAGNOSIS — Z7901 Long term (current) use of anticoagulants: Secondary | ICD-10-CM | POA: Diagnosis not present

## 2017-07-01 DIAGNOSIS — I5032 Chronic diastolic (congestive) heart failure: Secondary | ICD-10-CM | POA: Diagnosis not present

## 2017-07-01 DIAGNOSIS — F0390 Unspecified dementia without behavioral disturbance: Secondary | ICD-10-CM | POA: Diagnosis not present

## 2017-07-02 DIAGNOSIS — E1151 Type 2 diabetes mellitus with diabetic peripheral angiopathy without gangrene: Secondary | ICD-10-CM | POA: Diagnosis not present

## 2017-07-02 DIAGNOSIS — F0390 Unspecified dementia without behavioral disturbance: Secondary | ICD-10-CM | POA: Diagnosis not present

## 2017-07-02 DIAGNOSIS — I110 Hypertensive heart disease with heart failure: Secondary | ICD-10-CM | POA: Diagnosis not present

## 2017-07-02 DIAGNOSIS — I5032 Chronic diastolic (congestive) heart failure: Secondary | ICD-10-CM | POA: Diagnosis not present

## 2017-07-02 DIAGNOSIS — Z794 Long term (current) use of insulin: Secondary | ICD-10-CM | POA: Diagnosis not present

## 2017-07-02 DIAGNOSIS — Z7901 Long term (current) use of anticoagulants: Secondary | ICD-10-CM | POA: Diagnosis not present

## 2017-07-06 DIAGNOSIS — I5032 Chronic diastolic (congestive) heart failure: Secondary | ICD-10-CM | POA: Diagnosis not present

## 2017-07-06 DIAGNOSIS — Z794 Long term (current) use of insulin: Secondary | ICD-10-CM | POA: Diagnosis not present

## 2017-07-06 DIAGNOSIS — F0390 Unspecified dementia without behavioral disturbance: Secondary | ICD-10-CM | POA: Diagnosis not present

## 2017-07-06 DIAGNOSIS — E1151 Type 2 diabetes mellitus with diabetic peripheral angiopathy without gangrene: Secondary | ICD-10-CM | POA: Diagnosis not present

## 2017-07-06 DIAGNOSIS — I110 Hypertensive heart disease with heart failure: Secondary | ICD-10-CM | POA: Diagnosis not present

## 2017-07-06 DIAGNOSIS — Z7901 Long term (current) use of anticoagulants: Secondary | ICD-10-CM | POA: Diagnosis not present

## 2017-07-08 DIAGNOSIS — Z794 Long term (current) use of insulin: Secondary | ICD-10-CM | POA: Diagnosis not present

## 2017-07-08 DIAGNOSIS — F0390 Unspecified dementia without behavioral disturbance: Secondary | ICD-10-CM | POA: Diagnosis not present

## 2017-07-08 DIAGNOSIS — E1151 Type 2 diabetes mellitus with diabetic peripheral angiopathy without gangrene: Secondary | ICD-10-CM | POA: Diagnosis not present

## 2017-07-08 DIAGNOSIS — I5032 Chronic diastolic (congestive) heart failure: Secondary | ICD-10-CM | POA: Diagnosis not present

## 2017-07-08 DIAGNOSIS — I110 Hypertensive heart disease with heart failure: Secondary | ICD-10-CM | POA: Diagnosis not present

## 2017-07-08 DIAGNOSIS — Z7901 Long term (current) use of anticoagulants: Secondary | ICD-10-CM | POA: Diagnosis not present

## 2017-07-13 DIAGNOSIS — F0390 Unspecified dementia without behavioral disturbance: Secondary | ICD-10-CM | POA: Diagnosis not present

## 2017-07-13 DIAGNOSIS — E1151 Type 2 diabetes mellitus with diabetic peripheral angiopathy without gangrene: Secondary | ICD-10-CM | POA: Diagnosis not present

## 2017-07-13 DIAGNOSIS — Z794 Long term (current) use of insulin: Secondary | ICD-10-CM | POA: Diagnosis not present

## 2017-07-13 DIAGNOSIS — I110 Hypertensive heart disease with heart failure: Secondary | ICD-10-CM | POA: Diagnosis not present

## 2017-07-13 DIAGNOSIS — I5032 Chronic diastolic (congestive) heart failure: Secondary | ICD-10-CM | POA: Diagnosis not present

## 2017-07-13 DIAGNOSIS — Z7901 Long term (current) use of anticoagulants: Secondary | ICD-10-CM | POA: Diagnosis not present

## 2017-07-16 DIAGNOSIS — E1151 Type 2 diabetes mellitus with diabetic peripheral angiopathy without gangrene: Secondary | ICD-10-CM | POA: Diagnosis not present

## 2017-07-16 DIAGNOSIS — Z7901 Long term (current) use of anticoagulants: Secondary | ICD-10-CM | POA: Diagnosis not present

## 2017-07-16 DIAGNOSIS — I110 Hypertensive heart disease with heart failure: Secondary | ICD-10-CM | POA: Diagnosis not present

## 2017-07-16 DIAGNOSIS — Z794 Long term (current) use of insulin: Secondary | ICD-10-CM | POA: Diagnosis not present

## 2017-07-16 DIAGNOSIS — I5032 Chronic diastolic (congestive) heart failure: Secondary | ICD-10-CM | POA: Diagnosis not present

## 2017-07-16 DIAGNOSIS — F0390 Unspecified dementia without behavioral disturbance: Secondary | ICD-10-CM | POA: Diagnosis not present

## 2017-10-02 ENCOUNTER — Emergency Department (HOSPITAL_COMMUNITY)
Admission: EM | Admit: 2017-10-02 | Discharge: 2017-10-02 | Disposition: A | Discharge status: Home / Self Care | Payer: Medicare Other | Attending: Emergency Medicine | Admitting: Emergency Medicine

## 2017-10-02 ENCOUNTER — Encounter (HOSPITAL_COMMUNITY): Payer: Self-pay | Admitting: Emergency Medicine

## 2017-10-02 ENCOUNTER — Other Ambulatory Visit: Payer: Self-pay

## 2017-10-02 DIAGNOSIS — R410 Disorientation, unspecified: Secondary | ICD-10-CM | POA: Diagnosis not present

## 2017-10-02 DIAGNOSIS — R279 Unspecified lack of coordination: Secondary | ICD-10-CM | POA: Diagnosis not present

## 2017-10-02 DIAGNOSIS — Z87891 Personal history of nicotine dependence: Secondary | ICD-10-CM | POA: Insufficient documentation

## 2017-10-02 DIAGNOSIS — E11649 Type 2 diabetes mellitus with hypoglycemia without coma: Secondary | ICD-10-CM | POA: Diagnosis not present

## 2017-10-02 DIAGNOSIS — E162 Hypoglycemia, unspecified: Secondary | ICD-10-CM | POA: Insufficient documentation

## 2017-10-02 DIAGNOSIS — F0390 Unspecified dementia without behavioral disturbance: Secondary | ICD-10-CM | POA: Diagnosis not present

## 2017-10-02 DIAGNOSIS — I10 Essential (primary) hypertension: Secondary | ICD-10-CM | POA: Diagnosis not present

## 2017-10-02 DIAGNOSIS — Z743 Need for continuous supervision: Secondary | ICD-10-CM | POA: Diagnosis not present

## 2017-10-02 LAB — CBC
HCT: 35.4 % — ABNORMAL LOW (ref 36.0–46.0)
Hemoglobin: 11.2 g/dL — ABNORMAL LOW (ref 12.0–15.0)
MCH: 27.9 pg (ref 26.0–34.0)
MCHC: 31.6 g/dL (ref 30.0–36.0)
MCV: 88.3 fL (ref 78.0–100.0)
Platelets: 338 10*3/uL (ref 150–400)
RBC: 4.01 MIL/uL (ref 3.87–5.11)
RDW: 16.1 % — ABNORMAL HIGH (ref 11.5–15.5)
WBC: 7.6 10*3/uL (ref 4.0–10.5)

## 2017-10-02 LAB — BASIC METABOLIC PANEL
ANION GAP: 12 (ref 5–15)
BUN: 14 mg/dL (ref 8–23)
CO2: 26 mmol/L (ref 22–32)
Calcium: 9.4 mg/dL (ref 8.9–10.3)
Chloride: 103 mmol/L (ref 98–111)
Creatinine, Ser: 1.06 mg/dL — ABNORMAL HIGH (ref 0.44–1.00)
GFR calc Af Amer: 57 mL/min — ABNORMAL LOW (ref >60)
GFR, EST NON AFRICAN AMERICAN: 49 mL/min — AB (ref >60)
Glucose, Bld: 89 mg/dL (ref 70–99)
POTASSIUM: 4.3 mmol/L (ref 3.5–5.1)
SODIUM: 141 mmol/L (ref 135–145)

## 2017-10-02 LAB — CBG MONITORING, ED
GLUCOSE-CAPILLARY: 59 mg/dL — AB (ref 70–99)
GLUCOSE-CAPILLARY: 105 mg/dL — AB (ref 70–99)
GLUCOSE-CAPILLARY: 138 mg/dL — AB (ref 70–99)

## 2017-10-02 NOTE — ED Notes (Signed)
Pt assisted back into the room again and given coloring pages to divert her attention

## 2017-10-02 NOTE — ED Triage Notes (Signed)
Pt arrived via [facility] from [facility]. Facility said she has been having fluctuating blood sugars. Pt has been asymptomatic  Blood sugar 140 for EMS  Pt has dementia. Communication deficits. Pt is at baseline neurologically.

## 2017-10-02 NOTE — ED Provider Notes (Signed)
[facility] DEPT Provider Note   CSN: [id number] Arrival date & time: 10/02/17  1642     History   Chief Complaint Chief Complaint  Patient presents with  . Blood Sugar Problem    HPI Audrey Carr is a 77 y.o. female.  Patient with a history of dementia.  Sent in by EMS from [facility].  Facility stated that her blood sugars have been fluctuating a lot today.  Patient without any symptoms.  Blood sugar per EMS was 140.  They reported she does have dementia his communication deficits but the facility reported that her neurological function was baseline.     Past Medical History:  Diagnosis Date  . Diabetes mellitus without complication ([place])   . Stroke [facility]) 10/12/2013    Patient Active Problem List   Diagnosis Date Noted  . Cerebral infarction due to thrombosis of right middle cerebral artery ([place]) 01/22/2014  . Essential hypertension 01/22/2014  . HLD (hyperlipidemia) 01/22/2014  . Type 2 diabetes mellitus with other circulatory complications ([place]) 77/41/2878  . Chronic diastolic heart failure ([place]) 10/16/2013  . CVA (cerebral vascular accident) ([place]) 10/12/2013  . Hypertension 10/12/2013  . Hyperlipidemia 10/12/2013  . Type 2 diabetes mellitus ([place]) 10/12/2013  . CVA (cerebral infarction) 10/12/2013    Past Surgical History:  Procedure Laterality Date  . ABDOMINAL HYSTERECTOMY       OB History   None      Home Medications    Prior to Admission medications   Medication Sig Start Date End Date Taking? Authorizing Provider  acetaminophen (TYLENOL) 500 MG tablet Take 500 mg by mouth every 6 (six) hours as needed for mild pain.    [provider]  alendronate (FOSAMAX) 70 MG tablet Take 70 mg by mouth once a week. Take with a full glass of water on an empty stomach.    [provider]  amLODipine (NORVASC) 5 MG tablet Take 5 mg by mouth daily.    [provider]  atorvastatin  (LIPITOR) 40 MG tablet Take 2 tablets (80 mg total) by mouth daily. Patient taking differently: Take 40 mg by mouth daily.  10/15/13   Ghimire, Henreitta Leber, MD  buPROPion (WELLBUTRIN XL) 150 MG 24 hr tablet Take 150 mg by mouth daily.    [provider]  Cholecalciferol (VITAMIN D-3) 5000 units TABS Take 5,000 Units by mouth daily.    [provider]  clopidogrel (PLAVIX) 75 MG tablet Take 1 tablet (75 mg total) by mouth daily. 10/15/13   Ghimire, Henreitta Leber, MD  donepezil (ARICEPT) 5 MG tablet Take 5 mg by mouth at bedtime.    [provider]  escitalopram (LEXAPRO) 10 MG tablet Take 2 tablets (20 mg total) by mouth daily. 11/22/13   Philmore Pali, NP  ezetimibe (ZETIA) 10 MG tablet Take 1 tablet (10 mg total) by mouth daily. Patient not taking: Reported on 02/21/2015 02/09/14   Rosalin Hawking, MD  insulin aspart (NOVOLOG) 100 UNIT/ML injection 0-9 Units, Subcutaneous, 3 times daily with meals CBG < 70: implement hypoglycemia protocol CBG 70 - 120: 0 units CBG 121 - 150: 1 unit CBG 151 - 200: 2 units CBG 201 - 250: 3 units CBG 251 - 300: 5 units CBG 301 - 350: 7 units CBG 351 - 400: 9 units CBG > 400: call MD Patient taking differently: Inject 10 Units into the skin 3 (three) times daily with meals.  10/15/13   GhimireHenreitta Leber, MD  Insulin Glargine (BASAGLAR KWIKPEN) 100 UNIT/ML SOPN Inject 50 Units into the skin every morning.    [provider]  trimethoprim (TRIMPEX) 100 MG tablet Take 100 mg by mouth daily.    [provider]    Family History Family History  Problem Relation Age of Onset  . Stroke Mother     Social History Social History   Tobacco Use  . Smoking status: Former Smoker    Packs/day: 1.00    Types: Cigarettes  . Smokeless tobacco: Never Used  Substance Use Topics  . Alcohol use: No    Alcohol/week: 0.0 standard drinks  . Drug use: No     Allergies   Patient has no known allergies.   Review of Systems Review of  Systems  Unable to perform ROS: Dementia     Physical Exam Updated Vital Signs BP (!) 154/81   Pulse 74   Temp 98.2 F (36.8 C) (Oral)   Resp 16   SpO2 100%   Physical Exam  Constitutional: She appears well-developed and well-nourished. No distress.  HENT:  Head: Normocephalic and atraumatic.  Mouth/Throat: Oropharynx is clear and moist.  Eyes: Pupils are equal, round, and reactive to light. Conjunctivae and EOM are normal.  Neck: Normal range of motion. Neck supple.  Cardiovascular: Normal rate, regular rhythm and normal heart sounds.  Pulmonary/Chest: Effort normal and breath sounds normal. No respiratory distress.  Abdominal: Soft. Bowel sounds are normal. There is no tenderness.  Musculoskeletal: Normal range of motion.  Neurological: She is alert. No sensory deficit. She exhibits normal muscle tone.  Skin: Skin is warm.  Nursing note and vitals reviewed.    ED Treatments / Results  Labs (all labs ordered are listed, but only abnormal results are displayed) Labs Reviewed  CBC - Abnormal; Notable for the following components:      Result Value   Hemoglobin 11.2 (*)    HCT 35.4 (*)    RDW 16.1 (*)    All other components within normal limits  BASIC METABOLIC PANEL - Abnormal; Notable for the following components:   Creatinine, Ser 1.06 (*)    GFR calc non Af Amer 49 (*)    GFR calc Af Amer 57 (*)    All other components within normal limits  CBG MONITORING, ED - Abnormal; Notable for the following components:   Glucose-Capillary 59 (*)    All other components within normal limits    EKG None  Radiology No results found.  Procedures Procedures (including critical care time)  Medications Ordered in ED Medications - No data to display   Initial Impression / Assessment and Plan / ED Course  I have reviewed the triage vital signs and the nursing notes.  Pertinent labs & imaging results that were available during my care of the patient were reviewed by  me and considered in my medical decision making (see chart for details).    Patient's initial blood sugar here was fine EMS got a blood sugar of 140.  Patient asymptomatic.  However repeat fingerstick here showed blood sugar of 59.  Her initial blood sugar here on her basic labs was in the 80s.  Patient was fed and since that time we have had blood sugars above 100.  Patient observed seems to be maintaining her blood sugars.  Not clear why blood sugars got low perhaps her morning insulin dose is too high she is also on a sliding scale.  Perhaps she got too much of that.  Patient's  oral intake may have been less today.  Recommend cutting her morning insulin and half having her follow-up with her doctor.  Patient currently stable for discharge back to [facility].   Final Clinical Impressions(s) / ED Diagnoses   Final diagnoses:  Hypoglycemia    ED Discharge Orders    None       Fredia Sorrow, MD 10/02/17 2200

## 2017-10-02 NOTE — ED Notes (Signed)
Pt refuses to let us take vital signs

## 2017-10-02 NOTE — ED Notes (Signed)
[facility] at bedside 

## 2017-10-02 NOTE — ED Notes (Addendum)
Pt attempted to leave again and assisted back into room. Sitter requested

## 2017-10-02 NOTE — Discharge Instructions (Addendum)
Patient's blood sugars initially here did dip down.  Patient was fed.  She is now holding her blood sugars above 100.  Not sure if her morning dose of insulin is too stronger of if the sliding scales have been too strong for her.  Would follow her sugars carefully.  Would recommend cutting her morning dose of insulin in half.  Have her follow-up with her regular doctor.

## 2017-10-02 NOTE — ED Notes (Signed)
[facility] contacted to take pt to [facility]

## 2017-10-02 NOTE — ED Notes (Signed)
Report given to [facility] at [facility]

## 2017-10-02 NOTE — ED Notes (Signed)
Bed: WA10 Expected date: 10/02/17 Expected time: 4:42 PM Means of arrival: Ambulance Comments: Blood Sugar Fluctuating

## 2017-10-02 NOTE — ED Notes (Signed)
Pt attempting to leave room stating "I don't want to be here. I want to be in my own bed." Pt redirected and assisted back into room. Sitter requested
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
## 2017-10-19 DIAGNOSIS — Z6833 Body mass index (BMI) 33.0-33.9, adult: Secondary | ICD-10-CM | POA: Diagnosis not present

## 2017-10-19 DIAGNOSIS — Z1389 Encounter for screening for other disorder: Secondary | ICD-10-CM | POA: Diagnosis not present

## 2017-10-19 DIAGNOSIS — I10 Essential (primary) hypertension: Secondary | ICD-10-CM | POA: Diagnosis not present

## 2017-10-19 DIAGNOSIS — E1169 Type 2 diabetes mellitus with other specified complication: Secondary | ICD-10-CM | POA: Diagnosis not present

## 2017-10-19 DIAGNOSIS — I5032 Chronic diastolic (congestive) heart failure: Secondary | ICD-10-CM | POA: Diagnosis not present

## 2017-10-19 DIAGNOSIS — F0390 Unspecified dementia without behavioral disturbance: Secondary | ICD-10-CM | POA: Diagnosis not present

## 2017-10-19 DIAGNOSIS — N3281 Overactive bladder: Secondary | ICD-10-CM | POA: Diagnosis not present

## 2017-10-19 DIAGNOSIS — M545 Low back pain: Secondary | ICD-10-CM | POA: Diagnosis not present

## 2017-10-19 DIAGNOSIS — E7849 Other hyperlipidemia: Secondary | ICD-10-CM | POA: Diagnosis not present

## 2017-11-01 DIAGNOSIS — N390 Urinary tract infection, site not specified: Secondary | ICD-10-CM | POA: Diagnosis not present

## 2017-11-10 ENCOUNTER — Emergency Department (HOSPITAL_COMMUNITY)
Admission: EM | Admit: 2017-11-10 | Discharge: 2017-11-10 | Disposition: A | Discharge status: Home / Self Care | Payer: Medicare Other | Attending: Emergency Medicine | Admitting: Emergency Medicine

## 2017-11-10 ENCOUNTER — Other Ambulatory Visit: Payer: Self-pay

## 2017-11-10 ENCOUNTER — Encounter (HOSPITAL_COMMUNITY): Payer: Self-pay

## 2017-11-10 DIAGNOSIS — I10 Essential (primary) hypertension: Secondary | ICD-10-CM | POA: Insufficient documentation

## 2017-11-10 DIAGNOSIS — E119 Type 2 diabetes mellitus without complications: Secondary | ICD-10-CM | POA: Diagnosis not present

## 2017-11-10 DIAGNOSIS — T7421XA Adult sexual abuse, confirmed, initial encounter: Secondary | ICD-10-CM | POA: Diagnosis not present

## 2017-11-10 DIAGNOSIS — Z79899 Other long term (current) drug therapy: Secondary | ICD-10-CM | POA: Insufficient documentation

## 2017-11-10 DIAGNOSIS — E1165 Type 2 diabetes mellitus with hyperglycemia: Secondary | ICD-10-CM | POA: Diagnosis not present

## 2017-11-10 DIAGNOSIS — Z0441 Encounter for examination and observation following alleged adult rape: Secondary | ICD-10-CM | POA: Diagnosis not present

## 2017-11-10 DIAGNOSIS — E785 Hyperlipidemia, unspecified: Secondary | ICD-10-CM | POA: Diagnosis not present

## 2017-11-10 DIAGNOSIS — R404 Transient alteration of awareness: Secondary | ICD-10-CM | POA: Diagnosis not present
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
## 2017-11-10 NOTE — SANE Note (Signed)
FNE received another phone call from Dr. Regenia Skeeter at approximately 2120.  Patient's guardian is at hospital and is willing to sign consents.  FNE will respond to [facility].

## 2017-11-10 NOTE — ED Notes (Signed)
Guardian of patient is in room. Plan with EDP discussed regarding SANE exam.   Laura-Director at [facility] at [facility] for update on patient. May be contacted at [phone].
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
## 2017-11-10 NOTE — ED Notes (Signed)
SANE RN at bedside.

## 2017-12-31 ENCOUNTER — Other Ambulatory Visit: Payer: Self-pay

## 2017-12-31 ENCOUNTER — Encounter (HOSPITAL_COMMUNITY): Payer: Self-pay

## 2017-12-31 ENCOUNTER — Emergency Department (HOSPITAL_COMMUNITY)
Admission: EM | Admit: 2017-12-31 | Discharge: 2018-01-01 | Disposition: A | Discharge status: Skilled Nursing Facility | Payer: Medicare Other | Attending: Emergency Medicine | Admitting: Emergency Medicine

## 2017-12-31 DIAGNOSIS — R739 Hyperglycemia, unspecified: Secondary | ICD-10-CM | POA: Diagnosis present

## 2017-12-31 DIAGNOSIS — Z8673 Personal history of transient ischemic attack (TIA), and cerebral infarction without residual deficits: Secondary | ICD-10-CM | POA: Insufficient documentation

## 2017-12-31 DIAGNOSIS — Z79899 Other long term (current) drug therapy: Secondary | ICD-10-CM | POA: Diagnosis not present

## 2017-12-31 DIAGNOSIS — F0390 Unspecified dementia without behavioral disturbance: Secondary | ICD-10-CM | POA: Insufficient documentation

## 2017-12-31 DIAGNOSIS — E1165 Type 2 diabetes mellitus with hyperglycemia: Secondary | ICD-10-CM | POA: Insufficient documentation

## 2017-12-31 DIAGNOSIS — I10 Essential (primary) hypertension: Secondary | ICD-10-CM | POA: Diagnosis not present

## 2017-12-31 DIAGNOSIS — R404 Transient alteration of awareness: Secondary | ICD-10-CM | POA: Diagnosis not present

## 2017-12-31 DIAGNOSIS — Z87891 Personal history of nicotine dependence: Secondary | ICD-10-CM | POA: Insufficient documentation

## 2017-12-31 LAB — URINALYSIS, ROUTINE W REFLEX MICROSCOPIC
Bacteria, UA: NONE SEEN
Bilirubin Urine: NEGATIVE
Glucose, UA: >=500 mg/dL — AB
Hgb urine dipstick: NEGATIVE
Ketones, ur: NEGATIVE mg/dL
Nitrite: NEGATIVE
PROTEIN: NEGATIVE mg/dL
Specific Gravity, Urine: 1.026 (ref 1.005–1.030)
pH: 5 (ref 5.0–8.0)

## 2017-12-31 LAB — CBG MONITORING, ED
Glucose-Capillary: 354 mg/dL — ABNORMAL HIGH (ref 70–99)
Glucose-Capillary: 230 mg/dL — ABNORMAL HIGH (ref 70–99)

## 2017-12-31 LAB — BASIC METABOLIC PANEL
Anion gap: 12 (ref 5–15)
BUN: 18 mg/dL (ref 8–23)
CALCIUM: 9.6 mg/dL (ref 8.9–10.3)
CO2: 23 mmol/L (ref 22–32)
Chloride: 99 mmol/L (ref 98–111)
Creatinine, Ser: 1.24 mg/dL — ABNORMAL HIGH (ref 0.44–1.00)
GFR calc Af Amer: 48 mL/min — ABNORMAL LOW (ref >60)
GFR calc non Af Amer: 42 mL/min — ABNORMAL LOW (ref >60)
Glucose, Bld: 319 mg/dL — ABNORMAL HIGH (ref 70–99)
Potassium: 4.1 mmol/L (ref 3.5–5.1)
Sodium: 134 mmol/L — ABNORMAL LOW (ref 135–145)

## 2017-12-31 LAB — CBC
HCT: 33.7 % — ABNORMAL LOW (ref 36.0–46.0)
Hemoglobin: 10.8 g/dL — ABNORMAL LOW (ref 12.0–15.0)
MCH: 28.8 pg (ref 26.0–34.0)
MCHC: 32 g/dL (ref 30.0–36.0)
MCV: 89.9 fL (ref 80.0–100.0)
Platelets: 296 10*3/uL (ref 150–400)
RBC: 3.75 MIL/uL — AB (ref 3.87–5.11)
RDW: 15.6 % — ABNORMAL HIGH (ref 11.5–15.5)
WBC: 7.8 10*3/uL (ref 4.0–10.5)
nRBC: 0 % (ref 0.0–0.2)

## 2017-12-31 MED ORDER — CEPHALEXIN 500 MG PO CAPS: 500.0000 mg | ORAL_CAPSULE | Freq: Three times a day (TID) | ORAL | 0 refills | Status: DC

## 2017-12-31 MED ORDER — CEPHALEXIN 500 MG PO CAPS
500.0000 mg | ORAL_CAPSULE | Freq: Once | ORAL | Status: AC
  Administered 2017-12-31: 500 mg via ORAL
  Filled 2017-12-31: qty 1

## 2017-12-31 MED ORDER — INSULIN ASPART 100 UNIT/ML ~~LOC~~ SOLN
5.0000 [IU] | Freq: Once | SUBCUTANEOUS | Status: AC
  Administered 2017-12-31: 5 [IU] via SUBCUTANEOUS
  Filled 2017-12-31: qty 1

## 2017-12-31 NOTE — ED Notes (Signed)
Urine culture sent down to lab with urinalysis. 
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
## 2017-12-31 NOTE — Discharge Instructions (Addendum)
Follow-up with your doctor next week for recheck 
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
## 2017-12-31 NOTE — ED Provider Notes (Signed)
[name] Provider Note   CSN: [id number] Arrival date & time: 12/31/17  2012     History   Chief Complaint Chief Complaint  Patient presents with  . Hyperglycemia    HPI Audrey Carr is a 77 y.o. female.  Patient brought to the emergency department for elevated sugar.  She is demented and a nursing home patient.  Patient does not have any complaints  The history is provided by the patient. No language interpreter was used.  Illness  This is a recurrent problem. The current episode started 12 to 24 hours ago. The problem occurs constantly. The problem has not changed since onset.Pertinent negatives include no chest pain, no abdominal pain and no headaches. Nothing aggravates the symptoms. Nothing relieves the symptoms.    Past Medical History:  Diagnosis Date  . Diabetes mellitus without complication ([place])   . Stroke [facility]) 10/12/2013    Patient Active Problem List   Diagnosis Date Noted  . Cerebral infarction due to thrombosis of right middle cerebral artery ([place]) 01/22/2014  . Essential hypertension 01/22/2014  . HLD (hyperlipidemia) 01/22/2014  . Type 2 diabetes mellitus with other circulatory complications ([place]) 50/09/3816  . Chronic diastolic heart failure ([place]) 10/16/2013  . CVA (cerebral vascular accident) ([place]) 10/12/2013  . Hypertension 10/12/2013  . Hyperlipidemia 10/12/2013  . Type 2 diabetes mellitus ([place]) 10/12/2013  . CVA (cerebral infarction) 10/12/2013    Past Surgical History:  Procedure Laterality Date  . ABDOMINAL HYSTERECTOMY       OB History   No obstetric history on file.      Home Medications    Prior to Admission medications   Medication Sig Start Date End Date Taking? Authorizing Provider  acetaminophen (TYLENOL) 500 MG tablet Take 500 mg by mouth every 6 (six) hours as needed for mild pain.   Yes [provider]  alendronate (FOSAMAX) 70 MG tablet Take 70 mg by mouth once a week.  Take first thing in the morning with no food/drink/other meds before it. Take with 6oz of water. No food/drink/other meds after taking it for 30 mins. Do not lie down for 1-hour after taking dose.   Yes [provider]  amLODipine (NORVASC) 5 MG tablet Take 5 mg by mouth daily.   Yes [provider]  atorvastatin (LIPITOR) 40 MG tablet Take 2 tablets (80 mg total) by mouth daily. Patient taking differently: Take 40 mg by mouth daily.  10/15/13  Yes Ghimire, Henreitta Leber, MD  Bismuth Subsalicylate (KAOPECTATE) 262 MG TABS Take 524 mg by mouth every 6 (six) hours as needed (loose stools).   Yes [provider]  buPROPion (WELLBUTRIN XL) 150 MG 24 hr tablet Take 150 mg by mouth daily.   Yes [provider]  Cholecalciferol (VITAMIN D-3) 5000 units TABS Take 5,000 Units by mouth daily.   Yes [provider]  clopidogrel (PLAVIX) 75 MG tablet Take 1 tablet (75 mg total) by mouth daily. 10/15/13  Yes Ghimire, Henreitta Leber, MD  donepezil (ARICEPT) 5 MG tablet Take 5 mg by mouth at bedtime.   Yes [provider]  escitalopram (LEXAPRO) 20 MG tablet Take 20 mg by mouth daily.   Yes [provider]  insulin aspart (NOVOLOG) 100 UNIT/ML injection 0-9 Units, Subcutaneous, 3 times daily with meals CBG < 70: implement hypoglycemia protocol CBG 70 - 120: 0 units CBG 121 - 150: 1 unit CBG 151 - 200: 2 units CBG 201 - 250: 3 units CBG 251 -  300: 5 units CBG 301 - 350: 7 units CBG 351 - 400: 9 units CBG > 400: call MD Patient taking differently: Inject 12 Units into the skin 3 (three) times daily with meals.  10/15/13  Yes Ghimire, Henreitta Leber, MD  Insulin Glargine (BASAGLAR KWIKPEN) 100 UNIT/ML SOPN Inject 45 Units into the skin every morning.    Yes [provider]  trimethoprim (TRIMPEX) 100 MG tablet Take 100 mg by mouth daily.   Yes [provider]  cephALEXin (KEFLEX) 500 MG capsule Take 1 capsule (500 mg total) by mouth 3 (three) times  daily. 12/31/17   Milton Ferguson, MD    Family History Family History  Problem Relation Age of Onset  . Stroke Mother     Social History Social History   Tobacco Use  . Smoking status: Former Smoker    Packs/day: 1.00    Types: Cigarettes  . Smokeless tobacco: Never Used  Substance Use Topics  . Alcohol use: No    Alcohol/week: 0.0 standard drinks  . Drug use: No     Allergies   Patient has no known allergies.   Review of Systems Review of Systems  Constitutional: Negative for appetite change and fatigue.  HENT: Negative for congestion, ear discharge and sinus pressure.   Eyes: Negative for discharge.  Respiratory: Negative for cough.   Cardiovascular: Negative for chest pain.  Gastrointestinal: Negative for abdominal pain and diarrhea.  Genitourinary: Negative for frequency and hematuria.  Musculoskeletal: Negative for back pain.  Skin: Negative for rash.  Neurological: Negative for seizures and headaches.  Psychiatric/Behavioral: Negative for hallucinations.     Physical Exam Updated Vital Signs BP 133/74   Pulse 82   Temp 98.1 F (36.7 C) (Oral)   Resp 16   SpO2 99%   Physical Exam Constitutional:      Appearance: She is well-developed.  HENT:     Head: Normocephalic.  Eyes:     General: No scleral icterus.    Conjunctiva/sclera: Conjunctivae normal.  Neck:     Musculoskeletal: Neck supple.     Thyroid: No thyromegaly.  Cardiovascular:     Rate and Rhythm: Normal rate and regular rhythm.     Heart sounds: No murmur. No friction rub. No gallop.   Pulmonary:     Breath sounds: No stridor. No wheezing or rales.  Chest:     Chest wall: No tenderness.  Abdominal:     General: There is no distension.     Tenderness: There is no abdominal tenderness. There is no rebound.  Musculoskeletal: Normal range of motion.  Lymphadenopathy:     Cervical: No cervical adenopathy.  Skin:    Findings: No erythema or rash.  Neurological:     Mental Status:  She is alert and oriented to person, place, and time.     Motor: No abnormal muscle tone.     Coordination: Coordination normal.  Psychiatric:        Behavior: Behavior normal.      ED Treatments / Results  Labs (all labs ordered are listed, but only abnormal results are displayed) Labs Reviewed  BASIC METABOLIC PANEL - Abnormal; Notable for the following components:      Result Value   Sodium 134 (*)    Glucose, Bld 319 (*)    Creatinine, Ser 1.24 (*)    GFR calc non Af Amer 42 (*)    GFR calc Af Amer 48 (*)    All other components within normal limits  CBC - Abnormal; Notable for the following components:   RBC 3.75 (*)    Hemoglobin 10.8 (*)    HCT 33.7 (*)    RDW 15.6 (*)    All other components within normal limits  URINALYSIS, ROUTINE W REFLEX MICROSCOPIC - Abnormal; Notable for the following components:   Color, Urine STRAW (*)    Glucose, UA >=500 (*)    Leukocytes, UA TRACE (*)    All other components within normal limits  CBG MONITORING, ED - Abnormal; Notable for the following components:   Glucose-Capillary 354 (*)    All other components within normal limits  URINE CULTURE  CBG MONITORING, ED    EKG None  Radiology No results found.  Procedures Procedures (including critical care time)  Medications Ordered in ED Medications  insulin aspart (novoLOG) injection 5 Units (has no administration in time range)  cephALEXin (KEFLEX) capsule 500 mg (has no administration in time range)     Initial Impression / Assessment and Plan / ED Course  I have reviewed the triage vital signs and the nursing notes.  Pertinent labs & imaging results that were available during my care of the patient were reviewed by me and considered in my medical decision making (see chart for details). Patient with elevated glucose and probable urinary tract infection.  Patient will be placed on Keflex and her sugars will be followed at the nursing home      Final Clinical  Impressions(s) / ED Diagnoses   Final diagnoses:  Hyperglycemia    ED Discharge Orders         Ordered    cephALEXin (KEFLEX) 500 MG capsule  3 times daily     12/31/17 2259           Milton Ferguson, MD 12/31/17 2302

## 2017-12-31 NOTE — ED Notes (Signed)
Pt CBG 230. RN Aware

## 2018-01-01 LAB — CBG MONITORING, ED: Glucose-Capillary: 194 mg/dL — ABNORMAL HIGH (ref 70–99)

## 2018-01-01 NOTE — ED Notes (Signed)
Pt CBG 194. RN aware

## 2018-01-01 NOTE — ED Notes (Signed)
PT DISCHARGED. INSTRUCTIONS AND PRESCRIPTION GIVEN TO PTAR STAFF. AAOX3. PT IN NO APPARENT DISTRESS OR PAIN. THE OPPORTUNITY TO ASK QUESTIONS WAS PROVIDED.

## 2018-01-03 LAB — URINE CULTURE: Culture: >=100000 — AB

## 2018-01-04 ENCOUNTER — Telehealth: Payer: Self-pay

## 2018-01-04 NOTE — Telephone Encounter (Signed)
Post ED Visit - Positive Culture Follow-up  Culture report reviewed by antimicrobial stewardship pharmacist:  []  Elenor Quinones, Pharm.D. []  Heide Guile, Pharm.D., BCPS AQ-ID []  Parks Neptune, Pharm.D., BCPS []  Alycia Rossetti, Pharm.D., BCPS []  [place], Pharm.D., BCPS, AAHIVP []  Legrand Como, Pharm.D., BCPS, AAHIVP []  Salome Arnt, PharmD, BCPS []  Johnnette Gourd, PharmD, BCPS []  Hughes Better, PharmD, BCPS []  Leeroy Cha, PharmD [facility] Positive urine culture Treated with Cephalexin, organism sensitive to the same and no further patient follow-up is required at this time.  Genia Del 01/04/2018, 11:34 AM

## 2018-01-13 ENCOUNTER — Other Ambulatory Visit: Payer: Self-pay | Admitting: Podiatry

## 2018-01-13 ENCOUNTER — Encounter: Payer: Self-pay | Admitting: Podiatry

## 2018-01-13 ENCOUNTER — Ambulatory Visit (INDEPENDENT_AMBULATORY_CARE_PROVIDER_SITE_OTHER): Payer: Medicare Other

## 2018-01-13 ENCOUNTER — Ambulatory Visit (INDEPENDENT_AMBULATORY_CARE_PROVIDER_SITE_OTHER): Payer: Medicare Other | Admitting: Podiatry

## 2018-01-13 VITALS — BP 111/59 | HR 83 | Resp 16

## 2018-01-13 DIAGNOSIS — E11621 Type 2 diabetes mellitus with foot ulcer: Secondary | ICD-10-CM

## 2018-01-13 DIAGNOSIS — E1142 Type 2 diabetes mellitus with diabetic polyneuropathy: Secondary | ICD-10-CM

## 2018-01-13 DIAGNOSIS — M2012 Hallux valgus (acquired), left foot: Secondary | ICD-10-CM

## 2018-01-13 DIAGNOSIS — L97522 Non-pressure chronic ulcer of other part of left foot with fat layer exposed: Principal | ICD-10-CM

## 2018-01-13 DIAGNOSIS — M2011 Hallux valgus (acquired), right foot: Secondary | ICD-10-CM

## 2018-01-13 DIAGNOSIS — M2041 Other hammer toe(s) (acquired), right foot: Secondary | ICD-10-CM

## 2018-01-13 DIAGNOSIS — L97421 Non-pressure chronic ulcer of left heel and midfoot limited to breakdown of skin: Secondary | ICD-10-CM

## 2018-01-13 DIAGNOSIS — M79672 Pain in left foot: Secondary | ICD-10-CM

## 2018-01-13 DIAGNOSIS — M2042 Other hammer toe(s) (acquired), left foot: Secondary | ICD-10-CM

## 2018-01-13 DIAGNOSIS — E119 Type 2 diabetes mellitus without complications: Secondary | ICD-10-CM

## 2018-01-13 NOTE — Progress Notes (Signed)
Subjective:  Patient ID: Audrey Carr, female    DOB: Jul 17, 1940,  MRN: [id number]  Chief Complaint  Patient presents with  . Wound Check    Left foot; dorsal; pt stated, "It does not hurt when I walk; but if I have a shoe on and it rubs my foot, it hurts"; x1 month; pt Diabetic Type 2; Sugar=210 at 8 am; 372 at noon; A1C=pt does not know-facility does not check   78 y.o. female presents for wound care.  History as above confirmed with patient  Review of Systems: Negative except as noted in the HPI. Denies N/V/F/Ch.  Past Medical History:  Diagnosis Date  . Diabetes mellitus without complication ([place])   . Stroke ([place]) 10/12/2013    Current Outpatient Medications:  .  acetaminophen (TYLENOL) 500 MG tablet, Take 500 mg by mouth every 6 (six) hours as needed for mild pain., Disp: , Rfl:  .  alendronate (FOSAMAX) 70 MG tablet, Take 70 mg by mouth once a week. Take first thing in the morning with no food/drink/other meds before it. Take with 6oz of water. No food/drink/other meds after taking it for 30 mins. Do not lie down for 1-hour after taking dose., Disp: , Rfl:  .  amLODipine (NORVASC) 5 MG tablet, Take 5 mg by mouth daily., Disp: , Rfl:  .  atorvastatin (LIPITOR) 40 MG tablet, Take 2 tablets (80 mg total) by mouth daily. (Patient taking differently: Take 40 mg by mouth daily. ), Disp: , Rfl:  .  Bismuth Subsalicylate (KAOPECTATE) 262 MG TABS, Take 524 mg by mouth every 6 (six) hours as needed (loose stools)., Disp: , Rfl:  .  buPROPion (WELLBUTRIN XL) 150 MG 24 hr tablet, Take 150 mg by mouth daily., Disp: , Rfl:  .  Cholecalciferol (VITAMIN D-3) 5000 units TABS, Take 5,000 Units by mouth daily., Disp: , Rfl:  .  clopidogrel (PLAVIX) 75 MG tablet, Take 1 tablet (75 mg total) by mouth daily., Disp: , Rfl:  .  donepezil (ARICEPT) 5 MG tablet, Take 5 mg by mouth at bedtime., Disp: , Rfl:  .  escitalopram (LEXAPRO) 20 MG tablet, Take 20 mg by mouth daily., Disp: , Rfl:  .  insulin  aspart (NOVOLOG) 100 UNIT/ML injection, 0-9 Units, Subcutaneous, 3 times daily with meals CBG < 70: implement hypoglycemia protocol CBG 70 - 120: 0 units CBG 121 - 150: 1 unit CBG 151 - 200: 2 units CBG 201 - 250: 3 units CBG 251 - 300: 5 units CBG 301 - 350: 7 units CBG 351 - 400: 9 units CBG > 400: call MD (Patient taking differently: Inject 12 Units into the skin 3 (three) times daily with meals. ), Disp: 10 mL, Rfl: 11 .  Insulin Glargine (BASAGLAR KWIKPEN) 100 UNIT/ML SOPN, Inject 45 Units into the skin every morning. , Disp: , Rfl:  .  trimethoprim (TRIMPEX) 100 MG tablet, Take 100 mg by mouth daily., Disp: , Rfl:   Social History   Tobacco Use  Smoking Status Former Smoker  . Packs/day: 1.00  . Types: Cigarettes  Smokeless Tobacco Never Used    No Known Allergies Objective:   Vitals:   01/13/18 1622  BP: (!) 111/59  Pulse: 83  Resp: 16   There is no height or weight on file to calculate BMI. Constitutional Well developed. Well nourished.  Vascular Dorsalis pedis pulses palpable bilaterally. Posterior tibial pulses palpable bilaterally. Capillary refill normal to all digits.  No cyanosis or clubbing noted. Pedal hair growth  normal.  Neurologic Normal speech. Oriented to person, place, and time. Protective sensation absent  Dermatologic Wound Location: Dorsal aspect left foot Wound Base: Epithelialized Peri-wound: Intact Exudate: None: wound tissue dry  Orthopedic: No pain to palpation either foot. HP deformity bilateral, hammertoe deformity bilateral   Radiographs: Taken and reviewed no acute fracture dislocations no underlying obvious erosions Assessment:   1. Diabetic ulcer of left midfoot associated with type 2 diabetes mellitus, limited to breakdown of skin ([place])   2. DM type 2 with diabetic peripheral neuropathy ([place])   3. Acquired hallux valgus of both feet   4. Hammer toes of both feet    Plan:  Patient was evaluated and treated and all questions  answered.  Ulcer left dorsal foot -No debridement performed today -X-rays reviewed as above -Discussed avoiding shoe gear likely to continue to cause irritation.  Recommended soft slippers -Would benefit from diabetic shoes due to underlying hallux abductovalgus deformity and hammertoes  Return in about 6 weeks (around 02/24/2018).

## 2018-01-25 ENCOUNTER — Other Ambulatory Visit: Payer: Medicare Other | Admitting: Orthotics

## 2018-02-01 ENCOUNTER — Ambulatory Visit: Payer: Medicare Other | Admitting: Orthotics

## 2018-02-01 DIAGNOSIS — M2012 Hallux valgus (acquired), left foot: Secondary | ICD-10-CM

## 2018-02-01 DIAGNOSIS — M2041 Other hammer toe(s) (acquired), right foot: Secondary | ICD-10-CM

## 2018-02-01 DIAGNOSIS — L97421 Non-pressure chronic ulcer of left heel and midfoot limited to breakdown of skin: Principal | ICD-10-CM

## 2018-02-01 DIAGNOSIS — E11621 Type 2 diabetes mellitus with foot ulcer: Secondary | ICD-10-CM

## 2018-02-01 DIAGNOSIS — M2042 Other hammer toe(s) (acquired), left foot: Secondary | ICD-10-CM

## 2018-02-01 DIAGNOSIS — M2011 Hallux valgus (acquired), right foot: Secondary | ICD-10-CM

## 2018-02-01 NOTE — Progress Notes (Signed)

## 2018-02-24 ENCOUNTER — Ambulatory Visit (INDEPENDENT_AMBULATORY_CARE_PROVIDER_SITE_OTHER): Payer: Medicare Other | Admitting: Podiatry

## 2018-02-24 DIAGNOSIS — E11621 Type 2 diabetes mellitus with foot ulcer: Secondary | ICD-10-CM

## 2018-02-24 DIAGNOSIS — L97421 Non-pressure chronic ulcer of left heel and midfoot limited to breakdown of skin: Secondary | ICD-10-CM | POA: Diagnosis not present

## 2018-03-12 NOTE — Progress Notes (Signed)
Subjective:  Patient ID: Audrey Carr, female    DOB: 12-Sep-1940,  MRN: [id number]  Chief Complaint  Patient presents with  . Foot Ulcer    F/U L dorsal ulcer Pt. states," only hurt if I wear a shoe that make sit hurt." -pt states the ulcer is healed -pt deneis N/V/F/Ch/drainage/redness/swelling  Tx: none   78 y.o. female presents for wound care.  History as above confirmed with patient  Review of Systems: Negative except as noted in the HPI. Denies N/V/F/Ch.  Past Medical History:  Diagnosis Date  . Diabetes mellitus without complication ([place])   . Stroke ([place]) 10/12/2013    Current Outpatient Medications:  .  acetaminophen (TYLENOL) 500 MG tablet, Take 500 mg by mouth every 6 (six) hours as needed for mild pain., Disp: , Rfl:  .  alendronate (FOSAMAX) 70 MG tablet, Take 70 mg by mouth once a week. Take first thing in the morning with no food/drink/other meds before it. Take with 6oz of water. No food/drink/other meds after taking it for 30 mins. Do not lie down for 1-hour after taking dose., Disp: , Rfl:  .  amLODipine (NORVASC) 5 MG tablet, Take 5 mg by mouth daily., Disp: , Rfl:  .  atorvastatin (LIPITOR) 40 MG tablet, Take 2 tablets (80 mg total) by mouth daily. (Patient taking differently: Take 40 mg by mouth daily. ), Disp: , Rfl:  .  Bismuth Subsalicylate (KAOPECTATE) 262 MG TABS, Take 524 mg by mouth every 6 (six) hours as needed (loose stools)., Disp: , Rfl:  .  buPROPion (WELLBUTRIN XL) 150 MG 24 hr tablet, Take 150 mg by mouth daily., Disp: , Rfl:  .  Cholecalciferol (VITAMIN D-3) 5000 units TABS, Take 5,000 Units by mouth daily., Disp: , Rfl:  .  clopidogrel (PLAVIX) 75 MG tablet, Take 1 tablet (75 mg total) by mouth daily., Disp: , Rfl:  .  donepezil (ARICEPT) 5 MG tablet, Take 5 mg by mouth at bedtime., Disp: , Rfl:  .  escitalopram (LEXAPRO) 20 MG tablet, Take 20 mg by mouth daily., Disp: , Rfl:  .  insulin aspart (NOVOLOG) 100 UNIT/ML injection, 0-9 Units,  Subcutaneous, 3 times daily with meals CBG < 70: implement hypoglycemia protocol CBG 70 - 120: 0 units CBG 121 - 150: 1 unit CBG 151 - 200: 2 units CBG 201 - 250: 3 units CBG 251 - 300: 5 units CBG 301 - 350: 7 units CBG 351 - 400: 9 units CBG > 400: call MD (Patient taking differently: Inject 12 Units into the skin 3 (three) times daily with meals. ), Disp: 10 mL, Rfl: 11 .  Insulin Glargine (BASAGLAR KWIKPEN) 100 UNIT/ML SOPN, Inject 45 Units into the skin every morning. , Disp: , Rfl:  .  trimethoprim (TRIMPEX) 100 MG tablet, Take 100 mg by mouth daily., Disp: , Rfl:   Social History   Tobacco Use  Smoking Status Former Smoker  . Packs/day: 1.00  . Types: Cigarettes  Smokeless Tobacco Never Used    No Known Allergies Objective:   There were no vitals filed for this visit. There is no height or weight on file to calculate BMI. Constitutional Well developed. Well nourished.  Vascular Dorsalis pedis pulses palpable bilaterally. Posterior tibial pulses palpable bilaterally. Capillary refill normal to all digits.  No cyanosis or clubbing noted. Pedal hair growth normal.  Neurologic Normal speech. Oriented to person, place, and time. Protective sensation absent  Dermatologic Wound Location: Dorsal aspect left foot Wound Base: Epithelialized Peri-wound:  Intact Exudate: None: wound tissue dry  Orthopedic: No pain to palpation either foot. HP deformity bilateral, hammertoe deformity bilateral   Radiographs: None Assessment:   No diagnosis found. Plan:  Patient was evaluated and treated and all questions answered.  Ulcer left dorsal foot -Remains healed -Follow-up as needed for routine foot care services  Return in about 3 months (around 05/25/2018) for Diabetic Foot Care.

## 2018-05-26 ENCOUNTER — Ambulatory Visit: Payer: Medicare Other | Admitting: Podiatry

## 2018-06-21 ENCOUNTER — Ambulatory Visit (INDEPENDENT_AMBULATORY_CARE_PROVIDER_SITE_OTHER): Payer: Medicare Other | Admitting: Podiatry

## 2018-06-21 ENCOUNTER — Other Ambulatory Visit: Payer: Self-pay

## 2018-06-21 ENCOUNTER — Ambulatory Visit (INDEPENDENT_AMBULATORY_CARE_PROVIDER_SITE_OTHER): Payer: Medicare Other | Admitting: Orthotics

## 2018-06-21 ENCOUNTER — Encounter: Payer: Self-pay | Admitting: Podiatry

## 2018-06-21 DIAGNOSIS — M79674 Pain in right toe(s): Secondary | ICD-10-CM

## 2018-06-21 DIAGNOSIS — B351 Tinea unguium: Secondary | ICD-10-CM | POA: Diagnosis not present

## 2018-06-21 DIAGNOSIS — M2041 Other hammer toe(s) (acquired), right foot: Secondary | ICD-10-CM

## 2018-06-21 DIAGNOSIS — M2011 Hallux valgus (acquired), right foot: Secondary | ICD-10-CM

## 2018-06-21 DIAGNOSIS — M2042 Other hammer toe(s) (acquired), left foot: Secondary | ICD-10-CM | POA: Diagnosis not present

## 2018-06-21 DIAGNOSIS — E1142 Type 2 diabetes mellitus with diabetic polyneuropathy: Secondary | ICD-10-CM | POA: Diagnosis not present

## 2018-06-21 DIAGNOSIS — E11621 Type 2 diabetes mellitus with foot ulcer: Secondary | ICD-10-CM

## 2018-06-21 DIAGNOSIS — L97421 Non-pressure chronic ulcer of left heel and midfoot limited to breakdown of skin: Secondary | ICD-10-CM | POA: Diagnosis not present

## 2018-06-21 DIAGNOSIS — M2012 Hallux valgus (acquired), left foot: Secondary | ICD-10-CM

## 2018-06-21 DIAGNOSIS — M79675 Pain in left toe(s): Secondary | ICD-10-CM

## 2018-06-21 NOTE — Patient Instructions (Signed)
Diabetes Mellitus and Foot Care  Foot care is an important part of your health, especially when you have diabetes. Diabetes may cause you to have problems because of poor blood flow (circulation) to your feet and legs, which can cause your skin to:   Become thinner and drier.   Break more easily.   Heal more slowly.   Peel and crack.  You may also have nerve damage (neuropathy) in your legs and feet, causing decreased feeling in them. This means that you may not notice minor injuries to your feet that could lead to more serious problems. Noticing and addressing any potential problems early is the best way to prevent future foot problems.  How to care for your feet  Foot hygiene   Wash your feet daily with warm water and mild soap. Do not use hot water. Then, pat your feet and the areas between your toes until they are completely dry. Do not soak your feet as this can dry your skin.   Trim your toenails straight across. Do not dig under them or around the cuticle. File the edges of your nails with an emery board or nail file.   Apply a moisturizing lotion or petroleum jelly to the skin on your feet and to dry, brittle toenails. Use lotion that does not contain alcohol and is unscented. Do not apply lotion between your toes.  Shoes and socks   Wear clean socks or stockings every day. Make sure they are not too tight. Do not wear knee-high stockings since they may decrease blood flow to your legs.   Wear shoes that fit properly and have enough cushioning. Always look in your shoes before you put them on to be sure there are no objects inside.   To break in new shoes, wear them for just a few hours a day. This prevents injuries on your feet.  Wounds, scrapes, corns, and calluses   Check your feet daily for blisters, cuts, bruises, sores, and redness. If you cannot see the bottom of your feet, use a mirror or ask someone for help.   Do not cut corns or calluses or try to remove them with medicine.   If you  find a minor scrape, cut, or break in the skin on your feet, keep it and the skin around it clean and dry. You may clean these areas with mild soap and water. Do not clean the area with peroxide, alcohol, or iodine.   If you have a wound, scrape, corn, or callus on your foot, look at it several times a day to make sure it is healing and not infected. Check for:  ? Redness, swelling, or pain.  ? Fluid or blood.  ? Warmth.  ? Pus or a bad smell.  General instructions   Do not cross your legs. This may decrease blood flow to your feet.   Do not use heating pads or hot water bottles on your feet. They may burn your skin. If you have lost feeling in your feet or legs, you may not know this is happening until it is too late.   Protect your feet from hot and cold by wearing shoes, such as at the beach or on hot pavement.   Schedule a complete foot exam at least once a year (annually) or more often if you have foot problems. If you have foot problems, report any cuts, sores, or bruises to your health care provider immediately.  Contact a health care provider if:     You have a medical condition that increases your risk of infection and you have any cuts, sores, or bruises on your feet.   You have an injury that is not healing.   You have redness on your legs or feet.   You feel burning or tingling in your legs or feet.   You have pain or cramps in your legs and feet.   Your legs or feet are numb.   Your feet always feel cold.   You have pain around a toenail.  Get help right away if:   You have a wound, scrape, corn, or callus on your foot and:  ? You have pain, swelling, or redness that gets worse.  ? You have fluid or blood coming from the wound, scrape, corn, or callus.  ? Your wound, scrape, corn, or callus feels warm to the touch.  ? You have pus or a bad smell coming from the wound, scrape, corn, or callus.  ? You have a fever.  ? You have a red line going up your leg.  Summary   Check your feet every day  for cuts, sores, red spots, swelling, and blisters.   Moisturize feet and legs daily.   Wear shoes that fit properly and have enough cushioning.   If you have foot problems, report any cuts, sores, or bruises to your health care provider immediately.   Schedule a complete foot exam at least once a year (annually) or more often if you have foot problems.  This information is not intended to replace advice given to you by your health care provider. Make sure you discuss any questions you have with your health care provider.  Document Released: 12/27/1999 Document Revised: 02/10/2017 Document Reviewed: 01/31/2016  [employer] Interactive Patient Education  2019 [employer]

## 2018-06-21 NOTE — Progress Notes (Signed)

## 2018-06-30 NOTE — Progress Notes (Signed)
Subjective:  Audrey Carr presents to clinic for preventative foot care on today with cc of  painful, thick, discolored, elongated toenails 1-5 b/l that become tender and cannot cut because of thickness. Pain is aggravated when wearing enclosed shoe gear.  She is a resident at [facility] at [facility] unit.  Her caretaker is present during the visit and states ulcer (from ill fitting shoe gear) on dorsal aspect of left foot has completely healed.   Crist Infante, MD is her PCP and last visit was 3 months ago.   Current Outpatient Medications:  .  acetaminophen (TYLENOL) 500 MG tablet, Take 500 mg by mouth every 6 (six) hours as needed for mild pain., Disp: , Rfl:  .  alendronate (FOSAMAX) 70 MG tablet, Take 70 mg by mouth once a week. Take first thing in the morning with no food/drink/other meds before it. Take with 6oz of water. No food/drink/other meds after taking it for 30 mins. Do not lie down for 1-hour after taking dose., Disp: , Rfl:  .  amLODipine (NORVASC) 5 MG tablet, Take 5 mg by mouth daily., Disp: , Rfl:  .  atorvastatin (LIPITOR) 40 MG tablet, Take 2 tablets (80 mg total) by mouth daily. (Patient taking differently: Take 40 mg by mouth daily. ), Disp: , Rfl:  .  Bismuth Subsalicylate (KAOPECTATE) 262 MG TABS, Take 524 mg by mouth every 6 (six) hours as needed (loose stools)., Disp: , Rfl:  .  buPROPion (WELLBUTRIN XL) 150 MG 24 hr tablet, Take 150 mg by mouth daily., Disp: , Rfl:  .  Cholecalciferol (VITAMIN D-3) 5000 units TABS, Take 5,000 Units by mouth daily., Disp: , Rfl:  .  clopidogrel (PLAVIX) 75 MG tablet, Take 1 tablet (75 mg total) by mouth daily., Disp: , Rfl:  .  donepezil (ARICEPT) 5 MG tablet, Take 5 mg by mouth at bedtime., Disp: , Rfl:  .  escitalopram (LEXAPRO) 20 MG tablet, Take 20 mg by mouth daily., Disp: , Rfl:  .  insulin aspart (NOVOLOG) 100 UNIT/ML injection, 0-9 Units, Subcutaneous, 3 times daily with meals CBG < 70: implement  hypoglycemia protocol CBG 70 - 120: 0 units CBG 121 - 150: 1 unit CBG 151 - 200: 2 units CBG 201 - 250: 3 units CBG 251 - 300: 5 units CBG 301 - 350: 7 units CBG 351 - 400: 9 units CBG > 400: call MD (Patient taking differently: Inject 12 Units into the skin 3 (three) times daily with meals. ), Disp: 10 mL, Rfl: 11 .  Insulin Glargine (BASAGLAR KWIKPEN) 100 UNIT/ML SOPN, Inject 45 Units into the skin every morning. , Disp: , Rfl:  .  trimethoprim (TRIMPEX) 100 MG tablet, Take 100 mg by mouth daily., Disp: , Rfl:    No Known Allergies   Objective: There were no vitals filed for this visit.  Physical Examination:  Vascular Examination: Capillary refill time immediate x 10 digits.  Palpable DP/PT pulses b/l.  Digital hair present b/l.  No edema noted b/l.  Skin temperature gradient WNL b/l.  Dermatological Examination: Skin with mild atrophy dorsally b/l.  No open wounds b/l.  No interdigital macerations noted b/l.  Elongated, thick, discolored brittle toenails with subungual debris and pain on dorsal palpation of nailbeds 1-5 b/l.  Musculoskeletal Examination: Muscle strength 5/5 to all muscle groups b/l.  Hammertoe deformity b/l.  No pain, crepitus or joint discomfort with active/passive ROM.  Neurological Examination: Sensation absent b/l with 10 gram monofilament.  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 Toenails 1-5 b/l were debrided in length and girth without iatrogenic laceration. 2.  Continue soft, supportive shoe gear daily. 3.  Report any pedal injuries to medical professional. 4.  Follow up 3 months. 5.  Patient/POA to call should there be a question/concern in there interim.

## 2018-09-13 DEATH — deceased

## 2018-09-27 ENCOUNTER — Ambulatory Visit: Payer: Medicare Other | Admitting: Podiatry
